# Patient Record
Sex: Female | Born: 1960
Health system: Southern US, Community
[De-identification: ages and names within clinical notes are randomized; demographics above are authoritative.]

## PROBLEM LIST (undated history)

## (undated) DIAGNOSIS — G43909 Migraine, unspecified, not intractable, without status migrainosus: Secondary | ICD-10-CM

## (undated) DIAGNOSIS — F319 Bipolar disorder, unspecified: Secondary | ICD-10-CM

---

## 2001-01-20 ENCOUNTER — Encounter: Admission: RE | Admit: 2001-01-20 | Discharge: 2001-01-20 | Payer: Self-pay | Admitting: Internal Medicine

## 2001-01-20 ENCOUNTER — Encounter: Payer: Self-pay | Admitting: Internal Medicine

## 2002-02-18 ENCOUNTER — Emergency Department (HOSPITAL_COMMUNITY): Admission: EM | Admit: 2002-02-18 | Discharge: 2002-02-18 | Payer: Self-pay

## 2002-07-17 ENCOUNTER — Other Ambulatory Visit: Admission: RE | Admit: 2002-07-17 | Discharge: 2002-07-17 | Payer: Self-pay | Admitting: Internal Medicine

## 2003-09-10 ENCOUNTER — Encounter (INDEPENDENT_AMBULATORY_CARE_PROVIDER_SITE_OTHER): Payer: Self-pay | Admitting: Specialist

## 2003-09-10 ENCOUNTER — Ambulatory Visit (HOSPITAL_COMMUNITY): Admission: RE | Admit: 2003-09-10 | Discharge: 2003-09-10 | Payer: Self-pay | Admitting: General Surgery

## 2003-11-09 ENCOUNTER — Other Ambulatory Visit: Admission: RE | Admit: 2003-11-09 | Discharge: 2003-11-09 | Payer: Self-pay | Admitting: Obstetrics & Gynecology

## 2004-04-18 ENCOUNTER — Emergency Department (HOSPITAL_COMMUNITY): Admission: EM | Admit: 2004-04-18 | Discharge: 2004-04-18 | Payer: Self-pay | Admitting: Emergency Medicine

## 2004-07-12 ENCOUNTER — Encounter: Admission: RE | Admit: 2004-07-12 | Discharge: 2004-07-12 | Payer: Self-pay | Admitting: Internal Medicine

## 2004-11-28 ENCOUNTER — Emergency Department (HOSPITAL_COMMUNITY): Admission: EM | Admit: 2004-11-28 | Discharge: 2004-11-28 | Payer: Self-pay | Admitting: Emergency Medicine

## 2005-01-02 ENCOUNTER — Other Ambulatory Visit: Admission: RE | Admit: 2005-01-02 | Discharge: 2005-01-02 | Payer: Self-pay | Admitting: Obstetrics & Gynecology

## 2005-01-21 ENCOUNTER — Encounter: Admission: RE | Admit: 2005-01-21 | Discharge: 2005-01-21 | Payer: Self-pay | Admitting: Family Medicine

## 2005-06-13 ENCOUNTER — Emergency Department (HOSPITAL_COMMUNITY): Admission: EM | Admit: 2005-06-13 | Discharge: 2005-06-14 | Payer: Self-pay | Admitting: Emergency Medicine

## 2005-10-30 ENCOUNTER — Ambulatory Visit: Payer: Self-pay | Admitting: Family Medicine

## 2005-11-22 ENCOUNTER — Emergency Department (HOSPITAL_COMMUNITY): Admission: EM | Admit: 2005-11-22 | Discharge: 2005-11-22 | Payer: Self-pay | Admitting: Family Medicine

## 2006-01-02 ENCOUNTER — Ambulatory Visit: Payer: Self-pay | Admitting: Family Medicine

## 2006-03-15 ENCOUNTER — Emergency Department (HOSPITAL_COMMUNITY): Admission: EM | Admit: 2006-03-15 | Discharge: 2006-03-15 | Payer: Self-pay | Admitting: Family Medicine

## 2006-05-15 ENCOUNTER — Ambulatory Visit: Payer: Self-pay | Admitting: Family Medicine

## 2006-05-17 ENCOUNTER — Ambulatory Visit: Payer: Self-pay | Admitting: Internal Medicine

## 2006-06-17 ENCOUNTER — Ambulatory Visit (HOSPITAL_COMMUNITY): Admission: RE | Admit: 2006-06-17 | Discharge: 2006-06-17 | Payer: Self-pay | Admitting: Family Medicine

## 2006-06-17 ENCOUNTER — Emergency Department (HOSPITAL_COMMUNITY): Admission: EM | Admit: 2006-06-17 | Discharge: 2006-06-17 | Payer: Self-pay | Admitting: Family Medicine

## 2008-10-06 ENCOUNTER — Emergency Department (HOSPITAL_COMMUNITY): Admission: EM | Admit: 2008-10-06 | Discharge: 2008-10-06 | Payer: Self-pay | Admitting: Family Medicine

## 2009-04-18 ENCOUNTER — Emergency Department (HOSPITAL_COMMUNITY): Admission: EM | Admit: 2009-04-18 | Discharge: 2009-04-18 | Payer: Self-pay | Admitting: Emergency Medicine

## 2010-09-24 ENCOUNTER — Encounter: Payer: Self-pay | Admitting: Family Medicine

## 2010-11-04 ENCOUNTER — Emergency Department (HOSPITAL_BASED_OUTPATIENT_CLINIC_OR_DEPARTMENT_OTHER)
Admission: EM | Admit: 2010-11-04 | Discharge: 2010-11-05 | Disposition: A | Discharge status: Home / Self Care | Payer: Self-pay | Attending: Emergency Medicine | Admitting: Emergency Medicine

## 2010-11-04 ENCOUNTER — Emergency Department (INDEPENDENT_AMBULATORY_CARE_PROVIDER_SITE_OTHER): Payer: Self-pay

## 2010-11-04 DIAGNOSIS — R05 Cough: Secondary | ICD-10-CM

## 2010-11-04 DIAGNOSIS — R0609 Other forms of dyspnea: Secondary | ICD-10-CM | POA: Insufficient documentation

## 2010-11-04 DIAGNOSIS — F17200 Nicotine dependence, unspecified, uncomplicated: Secondary | ICD-10-CM | POA: Insufficient documentation

## 2010-11-04 DIAGNOSIS — R059 Cough, unspecified: Secondary | ICD-10-CM | POA: Insufficient documentation

## 2010-11-04 DIAGNOSIS — J40 Bronchitis, not specified as acute or chronic: Secondary | ICD-10-CM | POA: Insufficient documentation

## 2010-11-04 DIAGNOSIS — R079 Chest pain, unspecified: Secondary | ICD-10-CM | POA: Insufficient documentation

## 2010-11-04 DIAGNOSIS — R0989 Other specified symptoms and signs involving the circulatory and respiratory systems: Secondary | ICD-10-CM | POA: Insufficient documentation

## 2010-12-19 LAB — POCT URINALYSIS DIP (DEVICE)
Nitrite: POSITIVE — AB
Protein, ur: 30 mg/dL — AB
Specific Gravity, Urine: 1.02 (ref 1.005–1.030)
Urobilinogen, UA: 0.2 mg/dL (ref 0.0–1.0)

## 2010-12-19 LAB — URINE CULTURE

## 2010-12-19 LAB — POCT I-STAT, CHEM 8
Creatinine, Ser: 0.7 mg/dL (ref 0.4–1.2)
HCT: 49 % — ABNORMAL HIGH (ref 36.0–46.0)
Hemoglobin: 16.7 g/dL — ABNORMAL HIGH (ref 12.0–15.0)
Sodium: 141 mEq/L (ref 135–145)
TCO2: 29 mmol/L (ref 0–100)

## 2011-01-19 NOTE — Op Note (Signed)
NAME:  Marilyn Ferguson, Marilyn Ferguson                           ACCOUNT NO.:  [id number]   MEDICAL RECORD NO.:  [id number]                   PATIENT TYPE:  AMB   LOCATION:  DAY                                  FACILITY:  [facility]   PHYSICIAN:  Timothy E. Earlene Plater, M.D.              DATE OF BIRTH:  23-Nov-1960   DATE OF PROCEDURE:  09/10/2003  DATE OF DISCHARGE:                                 OPERATIVE REPORT   PREOPERATIVE DIAGNOSES:  1. Mass, abdominal wall.  2. Mass of chin.   POSTOPERATIVE DIAGNOSES:  1. Sebaceous cyst of the chin.  2. Lipoma of abdominal wall.   OPERATIVE PROCEDURE:  Excision of lesions.   SURGEON:  Timothy E. Earlene Plater, M.D.   ANESTHESIA:  General.   [name] is 48, otherwise healthy.  Has these lesions as noted above and  wishes to have excisions and completely and thoroughly explained in the  office.  Because of her anxiety, general anesthesia is elected.  The patient  was seen and the lesions were identified and marked and a permit was signed.   She was taken to the operating room and placed supine and LMA anesthesia  provided.  The lesion of the left abdominal wall was noted, the ink mark  removed, and the skin prepped.  Marcaine 0.25% with epinephrine was used.  A  horizontal incision made of approximately 3 cm and a large, irregular,  multilobulated lipoma of greater than 6 cm removed.  I believe it was  completely removed.  There were no fragments, and the wound was empty.  Bleeding points were cauterized, the superficial subcu closed with Monocryl  and the skin closed with subcuticular Monocryl.  Steri-Strips applied, wound  intact, no evidence of complications.   The lesion of the chin was on the underside of the chin slightly to the left  of midline and was easily visible and palpable and had a pore.  This area  was prepped and draped with 0.25% Marcaine used, an elliptical incision made  to remove the pore and the underlying sebaceous cyst.  The sebaceous cyst  was  completely removed.  Bleeding was controlled, and the wound was closed  with inverted sutures of subcutaneous Monocryl.  No evidence of bleeding or  complication.  Steri-Strips applied.  She tolerated it well, counts correct.  She was removed to the recovery room.   Instructions and Percocet #24 given, and she will be seen and followed up as  an outpatient.                                               Timothy E. Earlene Plater, M.D.    TED/MEDQ  D:  09/10/2003  T:  09/10/2003  Job:  [id number]

## 2012-06-03 ENCOUNTER — Emergency Department (HOSPITAL_BASED_OUTPATIENT_CLINIC_OR_DEPARTMENT_OTHER): Payer: Medicare Other

## 2012-06-03 ENCOUNTER — Emergency Department (HOSPITAL_BASED_OUTPATIENT_CLINIC_OR_DEPARTMENT_OTHER)
Admission: EM | Admit: 2012-06-03 | Discharge: 2012-06-03 | Disposition: A | Discharge status: Home / Self Care | Payer: Medicare Other | Attending: Emergency Medicine | Admitting: Emergency Medicine

## 2012-06-03 ENCOUNTER — Encounter (HOSPITAL_BASED_OUTPATIENT_CLINIC_OR_DEPARTMENT_OTHER): Payer: Self-pay | Admitting: Family Medicine

## 2012-06-03 DIAGNOSIS — F17200 Nicotine dependence, unspecified, uncomplicated: Secondary | ICD-10-CM | POA: Insufficient documentation

## 2012-06-03 DIAGNOSIS — Z880 Allergy status to penicillin: Secondary | ICD-10-CM | POA: Insufficient documentation

## 2012-06-03 DIAGNOSIS — S43401A Unspecified sprain of right shoulder joint, initial encounter: Secondary | ICD-10-CM

## 2012-06-03 DIAGNOSIS — X500XXA Overexertion from strenuous movement or load, initial encounter: Secondary | ICD-10-CM | POA: Insufficient documentation

## 2012-06-03 DIAGNOSIS — IMO0002 Reserved for concepts with insufficient information to code with codable children: Secondary | ICD-10-CM | POA: Insufficient documentation

## 2012-06-03 MED ORDER — IBUPROFEN 800 MG PO TABS: 800.0000 mg | ORAL_TABLET | Freq: Three times a day (TID) | ORAL | Status: DC

## 2012-06-03 MED ORDER — HYDROCODONE-ACETAMINOPHEN 5-325 MG PO TABS: 2.0000 | ORAL_TABLET | ORAL | Status: DC | PRN

## 2012-06-03 NOTE — ED Notes (Signed)
Pt c/o of pain in right upper extremity(bicep area) that hurts with movement and making a fist. States it kind of radiate to neck but that more of a burning feeling. Took 5 Advil last night and 4 this morning.

## 2012-06-03 NOTE — ED Provider Notes (Signed)
History     CSN: [id number]  Arrival date & time 06/03/12  1231   First MD Initiated Contact with Patient 06/03/12 1309      Chief Complaint  Patient presents with  . Shoulder Pain    (Consider location/radiation/quality/duration/timing/severity/associated sxs/prior treatment) Patient is a 51 y.o. female presenting with shoulder pain. The history is provided by the patient. No language interpreter was used.  Shoulder Pain This is a new problem. Episode onset: 2 days ago. The problem occurs constantly. The problem has been unchanged. Associated symptoms include myalgias. Nothing aggravates the symptoms. She has tried nothing for the symptoms. The treatment provided moderate relief.  Pt reports she was doing squats and had pain in her right shoulder  History reviewed. No pertinent past medical history.  History reviewed. No pertinent past surgical history.  No family history on file.  History  Substance Use Topics  . Smoking status: Current Some Day Smoker  . Smokeless tobacco: Not on file  . Alcohol Use: No    OB History    Grav Para Term Preterm Abortions TAB SAB Ect Mult Living                  Review of Systems  Musculoskeletal: Positive for myalgias.  All other systems reviewed and are negative.    Allergies  Penicillins  Home Medications   Current Outpatient Rx  Name Route Sig Dispense Refill  . IBUPROFEN 200 MG PO TABS Oral Take 800 mg by mouth every 6 (six) hours as needed.      BP 137/84  Pulse 103  Temp 98.3 F (36.8 C) (Oral)  Resp 16  Ht 5' (1.524 m)  Wt 125 lb (56.7 kg)  BMI 24.41 kg/m2  SpO2 100%  Physical Exam  Nursing note and vitals reviewed. Constitutional: She appears well-developed and well-nourished.  HENT:  Head: Normocephalic.  Right Ear: External ear normal.  Musculoskeletal: Normal range of motion.       Tender right shoulder,  Pain with abduction and adduction  nv and ns intact  Neurological: She is alert.  Skin: Skin  is warm.  Psychiatric: She has a normal mood and affect.    ED Course  Procedures (including critical care time)  Labs Reviewed - No data to display No results found.   1. Sprain of shoulder, right       MDM  Pt placed in a sling.   I advised to have pt see Dr. Lajoyce Corners for evaluation.   Pt given rx for ibuprofen and hydrocodone        Lonia Skinner [place], [place] 06/03/12 1615

## 2012-06-03 NOTE — ED Notes (Signed)
Pt c/o right shoulder pain x 2 days while lifting weights. Pt sts she has h/o right shoulder injury. Pt has limited rom.

## 2012-06-04 NOTE — ED Provider Notes (Signed)
History/physical exam/procedure(s) were performed by non-physician practitioner and as supervising physician I was immediately available for consultation/collaboration. I have reviewed all notes and am in agreement with care and plan.   Hilario Quarry, MD 06/04/12 [phone]

## 2012-06-05 ENCOUNTER — Other Ambulatory Visit: Payer: Self-pay | Admitting: Orthopedic Surgery

## 2012-06-05 DIAGNOSIS — M25511 Pain in right shoulder: Secondary | ICD-10-CM

## 2012-06-09 ENCOUNTER — Other Ambulatory Visit: Payer: Medicare Other

## 2012-06-11 ENCOUNTER — Other Ambulatory Visit: Payer: Medicare Other

## 2012-10-02 ENCOUNTER — Emergency Department (HOSPITAL_COMMUNITY)
Admission: EM | Admit: 2012-10-02 | Discharge: 2012-10-02 | Disposition: A | Discharge status: Home / Self Care | Payer: Medicare Other | Attending: Emergency Medicine | Admitting: Emergency Medicine

## 2012-10-02 ENCOUNTER — Encounter (HOSPITAL_COMMUNITY): Payer: Self-pay

## 2012-10-02 DIAGNOSIS — F17200 Nicotine dependence, unspecified, uncomplicated: Secondary | ICD-10-CM | POA: Insufficient documentation

## 2012-10-02 DIAGNOSIS — R5381 Other malaise: Secondary | ICD-10-CM | POA: Insufficient documentation

## 2012-10-02 DIAGNOSIS — IMO0001 Reserved for inherently not codable concepts without codable children: Secondary | ICD-10-CM | POA: Insufficient documentation

## 2012-10-02 DIAGNOSIS — R1033 Periumbilical pain: Secondary | ICD-10-CM | POA: Insufficient documentation

## 2012-10-02 DIAGNOSIS — R630 Anorexia: Secondary | ICD-10-CM | POA: Insufficient documentation

## 2012-10-02 DIAGNOSIS — R509 Fever, unspecified: Secondary | ICD-10-CM | POA: Insufficient documentation

## 2012-10-02 DIAGNOSIS — H9209 Otalgia, unspecified ear: Secondary | ICD-10-CM | POA: Insufficient documentation

## 2012-10-02 DIAGNOSIS — R1110 Vomiting, unspecified: Secondary | ICD-10-CM

## 2012-10-02 DIAGNOSIS — R112 Nausea with vomiting, unspecified: Secondary | ICD-10-CM | POA: Insufficient documentation

## 2012-10-02 DIAGNOSIS — R197 Diarrhea, unspecified: Secondary | ICD-10-CM

## 2012-10-02 LAB — COMPREHENSIVE METABOLIC PANEL
AST: 14 U/L (ref 0–37)
Albumin: 3.7 g/dL (ref 3.5–5.2)
Alkaline Phosphatase: 52 U/L (ref 39–117)
BUN: 13 mg/dL (ref 6–23)
Chloride: 107 mEq/L (ref 96–112)
Potassium: 3.9 mEq/L (ref 3.5–5.1)
Total Bilirubin: 0.3 mg/dL (ref 0.3–1.2)

## 2012-10-02 LAB — URINALYSIS, ROUTINE W REFLEX MICROSCOPIC
Bilirubin Urine: NEGATIVE
Leukocytes, UA: NEGATIVE
Nitrite: NEGATIVE
Specific Gravity, Urine: 1.015 (ref 1.005–1.030)
pH: 7.5 (ref 5.0–8.0)

## 2012-10-02 LAB — CBC WITH DIFFERENTIAL/PLATELET
Basophils Absolute: 0 10*3/uL (ref 0.0–0.1)
Basophils Relative: 0 % (ref 0–1)
Eosinophils Relative: 1 % (ref 0–5)
HCT: 42.7 % (ref 36.0–46.0)
MCHC: 33 g/dL (ref 30.0–36.0)
MCV: 88 fL (ref 78.0–100.0)
Monocytes Absolute: 0.3 10*3/uL (ref 0.1–1.0)
RDW: 13.6 % (ref 11.5–15.5)

## 2012-10-02 LAB — URINE MICROSCOPIC-ADD ON

## 2012-10-02 MED ORDER — ONDANSETRON 4 MG PO TBDP
8.0000 mg | ORAL_TABLET | Freq: Once | ORAL | Status: AC
  Administered 2012-10-02: 8 mg via ORAL
  Filled 2012-10-02: qty 2

## 2012-10-02 MED ORDER — ONDANSETRON HCL 4 MG/2ML IJ SOLN
4.0000 mg | Freq: Once | INTRAMUSCULAR | Status: DC
  Filled 2012-10-02: qty 2

## 2012-10-02 MED ORDER — LOPERAMIDE HCL 2 MG PO CAPS: 2.0000 mg | ORAL_CAPSULE | Freq: Four times a day (QID) | ORAL | Status: DC | PRN

## 2012-10-02 MED ORDER — SODIUM CHLORIDE 0.9 % IV SOLN: 1000.0000 mL | Freq: Once | INTRAVENOUS | Status: DC

## 2012-10-02 MED ORDER — ONDANSETRON 8 MG PO TBDP: 8.0000 mg | ORAL_TABLET | Freq: Three times a day (TID) | ORAL | Status: DC | PRN

## 2012-10-02 MED ORDER — ACETAMINOPHEN 325 MG PO TABS
650.0000 mg | ORAL_TABLET | Freq: Once | ORAL | Status: AC
  Administered 2012-10-02: 650 mg via ORAL
  Filled 2012-10-02: qty 2

## 2012-10-02 MED ORDER — SODIUM CHLORIDE 0.9 % IV SOLN: 1000.0000 mL | INTRAVENOUS | Status: DC

## 2012-10-02 NOTE — ED Provider Notes (Signed)
History     CSN: [id number]  Arrival date & time 10/02/12  1154   First MD Initiated Contact with Patient 10/02/12 1224      No chief complaint on file.   Patient is a 52 y.o. female presenting with diarrhea and abdominal pain. The history is provided by the patient.  Diarrhea The primary symptoms include fever, abdominal pain, diarrhea and myalgias. Primary symptoms do not include nausea, vomiting or rash.  The diarrhea began 2 days ago. The diarrhea is watery, malodorous and mucous. The diarrhea occurs 2 to 4 times per day.  The illness is also significant for anorexia.  Abdominal Pain The primary symptoms of the illness include abdominal pain, fever and diarrhea. The primary symptoms of the illness do not include nausea or vomiting. The current episode started 2 days ago. The onset of the illness was gradual.  The pain came on gradually (cramping). The abdominal pain has been unchanged since its onset. The abdominal pain is located in the periumbilical region. The abdominal pain does not radiate (SHe tried imodium and her friend's vicodin with some relief.).  Additional symptoms associated with the illness include anorexia.  People at work have been sick.  So is a family member.  She was sent home from work and was told to get checked out.  History reviewed. No pertinent past medical history.  History reviewed. No pertinent past surgical history.  History reviewed. No pertinent family history.  History  Substance Use Topics  . Smoking status: Current Some Day Smoker  . Smokeless tobacco: Not on file  . Alcohol Use: No    OB History    Grav Para Term Preterm Abortions TAB SAB Ect Mult Living                  Review of Systems  Constitutional: Positive for fever.  Respiratory: Negative for cough.   Gastrointestinal: Positive for abdominal pain, diarrhea and anorexia. Negative for nausea and vomiting.  Musculoskeletal: Positive for myalgias.  Skin: Negative for rash.   All other systems reviewed and are negative.    Allergies  Penicillins  Home Medications   Current Outpatient Rx  Name  Route  Sig  Dispense  Refill  . HYDROCODONE-ACETAMINOPHEN 5-325 MG PO TABS   Oral   Take 2 tablets by mouth every 4 (four) hours as needed for pain.   10 tablet   0   . IBUPROFEN 200 MG PO TABS   Oral   Take 800 mg by mouth every 6 (six) hours as needed.         . IBUPROFEN 800 MG PO TABS   Oral   Take 1 tablet (800 mg total) by mouth 3 (three) times daily.   21 tablet   0     BP 137/71  Pulse 101  Temp 97.8 F (36.6 C) (Oral)  Resp 22  SpO2 100%  Physical Exam  Nursing note and vitals reviewed. Constitutional: She appears well-developed and well-nourished. No distress.  HENT:  Head: Normocephalic and atraumatic.  Right Ear: External ear normal.  Left Ear: External ear normal.  Mouth/Throat: No oropharyngeal exudate.  Eyes: Conjunctivae normal are normal. Right eye exhibits no discharge. Left eye exhibits no discharge. No scleral icterus.  Neck: Neck supple. No tracheal deviation present.  Cardiovascular: Normal rate, regular rhythm and intact distal pulses.   Pulmonary/Chest: Effort normal and breath sounds normal. No stridor. No respiratory distress. She has no wheezes. She has no rales.  Abdominal: Soft.  Bowel sounds are normal. She exhibits no distension. There is no tenderness. There is no rebound and no guarding.  Musculoskeletal: She exhibits no edema and no tenderness.  Neurological: She is alert. She has normal strength. No sensory deficit. Cranial nerve deficit:  no gross defecits noted. She exhibits normal muscle tone. She displays no seizure activity. Coordination normal.  Skin: Skin is warm and dry. No rash noted.  Psychiatric: She has a normal mood and affect.    ED Course  Procedures (including critical care time) Pt does not want an IV.  Would like to try oral fluids. Labs Reviewed  URINALYSIS, ROUTINE W REFLEX  MICROSCOPIC - Abnormal; Notable for the following:    APPearance CLOUDY (*)     Hgb urine dipstick SMALL (*)     All other components within normal limits  URINE MICROSCOPIC-ADD ON - Abnormal; Notable for the following:    Bacteria, UA MANY (*)     All other components within normal limits  COMPREHENSIVE METABOLIC PANEL  LIPASE, BLOOD  CBC WITH DIFFERENTIAL   No results found.   1. Vomiting and diarrhea       MDM  I suspect that the patient's symptoms are related to a viral illness. The patient be discharged home with medications for nausea and diarrhea. She is instructed to return to the emergency room for any worsening symptoms, fever or localized abdominal pain.       Celene Kras, MD 10/02/12 [phone]

## 2012-10-02 NOTE — ED Notes (Signed)
Pt c/o body ache, weakness, ear ache, nausea, vomiting, diarrhea, and abd pain x2 days. Pt states she has been unable to keep any food down. Pt states she has been taking imodium, tums, and half a vicodin, and fluids.

## 2012-10-02 NOTE — ED Notes (Signed)
Discharge instructions reviewed. Pt verbalized understanding.  

## 2012-10-02 NOTE — ED Notes (Signed)
Dr.Knapp at bedside  

## 2012-10-02 NOTE — ED Notes (Signed)
Pt presents with 2 day h/o suprapubic pain.  Pt reports pain is intermittent and worsens before having a bowel movement.  +nausea, vomiting and diarrhea.  Pt denies any dysuria but reports urine has foul odor, denies any vaginal discharge.

## 2013-03-24 ENCOUNTER — Other Ambulatory Visit: Payer: Self-pay

## 2013-10-13 ENCOUNTER — Other Ambulatory Visit: Payer: Self-pay | Admitting: Orthopaedic Surgery

## 2013-10-13 DIAGNOSIS — M79605 Pain in left leg: Secondary | ICD-10-CM

## 2013-10-13 DIAGNOSIS — M545 Low back pain, unspecified: Secondary | ICD-10-CM

## 2013-10-22 ENCOUNTER — Other Ambulatory Visit: Payer: Medicare Other

## 2013-10-29 ENCOUNTER — Other Ambulatory Visit: Payer: Medicare Other

## 2013-11-10 ENCOUNTER — Other Ambulatory Visit: Payer: Medicare Other

## 2013-11-29 ENCOUNTER — Other Ambulatory Visit: Payer: Medicare Other

## 2014-04-03 ENCOUNTER — Emergency Department (HOSPITAL_COMMUNITY): Payer: Medicare Other

## 2014-04-03 ENCOUNTER — Encounter (HOSPITAL_COMMUNITY): Payer: Self-pay | Admitting: Emergency Medicine

## 2014-04-03 ENCOUNTER — Emergency Department (HOSPITAL_COMMUNITY)
Admission: EM | Admit: 2014-04-03 | Discharge: 2014-04-03 | Disposition: A | Discharge status: Home / Self Care | Payer: Medicare Other | Attending: Emergency Medicine | Admitting: Emergency Medicine

## 2014-04-03 DIAGNOSIS — IMO0002 Reserved for concepts with insufficient information to code with codable children: Secondary | ICD-10-CM | POA: Diagnosis not present

## 2014-04-03 DIAGNOSIS — M549 Dorsalgia, unspecified: Secondary | ICD-10-CM | POA: Insufficient documentation

## 2014-04-03 DIAGNOSIS — Z79899 Other long term (current) drug therapy: Secondary | ICD-10-CM | POA: Diagnosis not present

## 2014-04-03 DIAGNOSIS — M4722 Other spondylosis with radiculopathy, cervical region: Secondary | ICD-10-CM

## 2014-04-03 DIAGNOSIS — M5380 Other specified dorsopathies, site unspecified: Secondary | ICD-10-CM | POA: Diagnosis not present

## 2014-04-03 DIAGNOSIS — M5412 Radiculopathy, cervical region: Secondary | ICD-10-CM | POA: Insufficient documentation

## 2014-04-03 DIAGNOSIS — Z880 Allergy status to penicillin: Secondary | ICD-10-CM | POA: Insufficient documentation

## 2014-04-03 DIAGNOSIS — Z87891 Personal history of nicotine dependence: Secondary | ICD-10-CM | POA: Diagnosis not present

## 2014-04-03 DIAGNOSIS — M62830 Muscle spasm of back: Secondary | ICD-10-CM

## 2014-04-03 MED ORDER — CYCLOBENZAPRINE HCL 5 MG PO TABS: 5.0000 mg | ORAL_TABLET | Freq: Three times a day (TID) | ORAL | Status: DC | PRN

## 2014-04-03 MED ORDER — PREDNISONE 20 MG PO TABS: 20.0000 mg | ORAL_TABLET | Freq: Two times a day (BID) | ORAL | Status: AC

## 2014-04-03 MED ORDER — PREDNISONE 20 MG PO TABS
60.0000 mg | ORAL_TABLET | Freq: Once | ORAL | Status: AC
  Administered 2014-04-03: 60 mg via ORAL
  Filled 2014-04-03: qty 3

## 2014-04-03 MED ORDER — TRAMADOL HCL 50 MG PO TABS: 50.0000 mg | ORAL_TABLET | Freq: Four times a day (QID) | ORAL | Status: DC | PRN

## 2014-04-03 NOTE — ED Provider Notes (Signed)
CSN: [id number]     Arrival date & time 04/03/14  1808 History   First MD Initiated Contact with Patient 04/03/14 1822   History provided by patient.   Chief Complaint  Patient presents with  . Back Pain   HPI  Patient reported acute onset Right upper back pain about 3 days ago, stated she woke up and after morning stretches got out of bed and "almost fell to her knees due to the severe pain" stated "the wind was knocked out of her", associated with sweating, felt nervous. Pain severe described as "aggravating aching" located Right mid back behind shoulder blade with radiation up to Right side of neck, no other radiation, no chest pain or low back pain. Denies paresthesias or radiating pain down arms/legs. Pain persisted throughout most of day and night, worse with activity, sitting up straight, turning head to Right, following 2 days admitted to intermittent improvement with pain, some relief with stretching and heating pad, no relief from vicodin and tramadol (stated she had left over from prior left wrist injury). Currently she woke up today with pain "3 times worse", pain worse with deep breaths and admits short of breath due to pain, however, if sitting leaning forward and not extending head, she is in 0/10 pain and "feels great". Admits to occasional tingling in Right hand. Denies other associated symptoms fevers/chills, HA, weakness, numbness, abdominal pain, n/v, urinary or stool incontinence.  Additional significant history, patient is active bodybuilder and weightlifter. Recently had not been to gym within 4 days prior to onset of symptoms. Denies recent injury, trauma, or accident.   History reviewed. No pertinent past medical history. Right shoulder rotator cuff tear  History reviewed. No pertinent past surgical history. No family history on file. History  Substance Use Topics  . Smoking status: Former Games developer  . Smokeless tobacco: Not on file  . Alcohol Use: No   OB History   Grav Para Term Preterm Abortions TAB SAB Ect Mult Living                 Review of Systems See above HPI   Allergies  Penicillins  Home Medications   Prior to Admission medications   Medication Sig Start Date End Date Taking? Authorizing Provider  HYDROcodone-acetaminophen (NORCO/VICODIN) 5-325 MG per tablet Take 2 tablets by mouth every 4 (four) hours as needed for pain. 06/03/12  Yes Lonia Skinner Sofia, PA-C  ibuprofen (ADVIL,MOTRIN) 200 MG tablet Take 200 mg by mouth every 6 (six) hours as needed for mild pain.   Yes Historical Provider, MD  traMADol (ULTRAM) 50 MG tablet Take 50 mg by mouth every 6 (six) hours as needed for moderate pain.   Yes Historical Provider, MD   BP 122/83  Pulse 104  Temp(Src) 98.2 F (36.8 C) (Oral)  Resp 18  SpO2 99% Physical Exam  Gen - well-appearing and healthy, cooperative / conversational, NAD HEENT - NCAT, PERRL, EOMI, oropharynx clear, MMM Neck - supple, non-tender to palpation. Limited R-rotation due to pain, FROM ext/flex and L-rotation Heart - Tachycardic, regular rhythm, no murmurs heard Lungs - CTAB, no wheezing, crackles, or rhonchi. Normal work of breathing. Abd - soft, NTND, no masses, +active BS Ext - non-tender, no edema, peripheral pulses intact +2 b/l MSK - Upper back/thoracic spine: no significant tenderness to palpation, mild tissue edema / muscle spasm with inc warmth and skin change vs left side, inc tenderness with C/T-spine extension, no tenderness over spinous processes down entire spine Skin - warm,  dry Neuro - awake, alert, oriented, grossly non-focal, intact muscle strength 5/5 b/l, intact distal sensation to light touch, gait normal   ED Course  Procedures (including critical care time) Labs Review Labs Reviewed - No data to display  Imaging Review No results found.   EKG Interpretation None      MDM   Final diagnoses:  None   52 yr F without significant PMH, previously healthy and active, presents with  acute episode of R-neck / Lower C / Upper T spine pain, x 3 days persistent with intermittent improvement, no acute inciting injury or trauma (regularly weightlifter, but no recent stress). Mild paresthesias down R-arm, symptoms improved with C/T-spine flexion and worsened with extension. Suspected radiculopathy with likely foraminal injury vs herniated disc (lower C-spine). Additional work-up no CP or SOB, EKG negative for acute changes. Vitals stable, pain controlled as long as not exacerbated by movement.  Proceed with CXR (rule out unlikely pneumothorax), C-spine x-ray series to eval for DJD. Start with Prednisone PO 60mg .  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 Discussed X-ray findings. Discharge with rx steroid burst with prednisone 20mg  BID x 5 days, Tramadol PRN pain, Flexeril PRN spasm, demonstrated manipulation scapular release technique, recommend cont heating pad, stretching and symptomatic control. Follow-up and return precautions.     Saralyn PilarAlexander Karamalegos, DO 04/03/14 2202

## 2014-04-03 NOTE — ED Notes (Signed)
Patient transported to X-ray 

## 2014-04-03 NOTE — ED Provider Notes (Signed)
Patient seen and evaluated with resident. Pertinent history and physical examination performed. Agree with initial assessment, evaluation and treatment initiated by resident. Face-to-face examination and review of evaluation findings were performed.  History includes- Right upper back pain with extension of neck and back. No trauma. No similar in past Physical Includes- Alert calm cooperative. TTP right thoracic, mild. Normal ROM arms and legs  EKG-   EKG Interpretation  Date/Time:  Saturday April 03 2014 18:14:37 EDT Ventricular Rate:  98 PR Interval:  156 QRS Duration: 70 QT Interval:  332 QTC Calculation: 423 R Axis:   93 Text Interpretation:  Sinus rhythm with marked sinus arrhythmia Rightward axis Septal infarct , age undetermined Abnormal ECG since last tracing no significant change Confirmed by Effie ShyWENTZ  MD, Kenny Rea [phone]([id number]) on 04/03/2014 9:47:36 PM       Disposition- home with symptomatic treatment.    Flint MelterElliott L Safa Derner, MD 04/04/14 [phone]

## 2014-04-03 NOTE — ED Notes (Signed)
Pt presents to department for evaluation of back pain. Ongoing x2 days. Denies recent injury. 8/10 pain upon arrival, states pain increases with deep breathing. 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 Pt is alert and oriented x4.

## 2014-04-03 NOTE — Discharge Instructions (Signed)
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 We performed an X-ray of your chest and your neck. The Chest X-ray was entirely normal. 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 This is most likely an acute flare of arthritis some possible nerve irritation and inflammation but no damage. Prescribed short steroid burst with Prednisone 20mg  tabs - take 1 tab twice daily (morning and evening) for total of 5 days. After complete course, you may try Ibuprofen as needed to help reduce inflammation. May use Tylenol For pain, prescribed Tramadol and Flexeril (can take 1-2 tabs up to 3 times daily for muscle spasm), be cautious when taking new medications as these may make you sleepy, common side effect is sedation, be careful with driving. Additionally, recommend to do conservative therapy, relative rest, avoid excessive activity or weightlifting during recovery, continue heating pad, muscle massage technique for shoulder blade. Recommend close follow-up within 1-2 weeks. Overall, this may take weeks to month to heal. Advise establishing with a primary doctor, call your insurance to locate one. If worsening symptoms acutely, or not improving in next few days, please return to the Emergency Department for further evaluation.  Osteoarthritis Osteoarthritis is a disease that causes soreness and inflammation of a joint. It occurs when the cartilage at the affected joint wears down. Cartilage acts as a cushion, covering the ends of bones where they meet to form a joint. Osteoarthritis is the most common form of arthritis. It often occurs in older people. The joints affected most often by this condition include those in the:  Ends of the fingers.  Thumbs.  Neck.  Lower back.  Knees.  Hips. CAUSES  Over time, the cartilage that covers the ends of  bones begins to wear away. This causes bone to rub on bone, producing pain and stiffness in the affected joints.  RISK FACTORS Certain factors can increase your chances of having osteoarthritis, including:  Older age.  Excessive body weight.  Overuse of joints.  Previous joint injury. SIGNS AND SYMPTOMS   Pain, swelling, and stiffness in the joint.  Over time, the joint may lose its normal shape.  Small deposits of bone (osteophytes) may grow on the edges of the joint.  Bits of bone or cartilage can break off and float inside the joint space. This may cause more pain and damage. DIAGNOSIS  Your health care provider will do a physical exam and ask about your symptoms. Various tests may be ordered, such as:  X-rays of the affected joint.  An MRI scan.  Blood tests to rule out other types of arthritis.  Joint fluid tests. This involves using a needle to draw fluid from the joint and examining the fluid under a microscope. TREATMENT  Goals of treatment are to control pain and improve joint function. Treatment plans may include:  A prescribed exercise program that allows for rest and joint relief.  A weight control plan.  Pain relief techniques, such as:  Properly applied heat and cold.  Electric pulses delivered to nerve endings under the skin (transcutaneous electrical nerve stimulation [TENS]).  Massage.  Certain nutritional supplements.  Medicines to control pain, such as:  Acetaminophen.  Nonsteroidal anti-inflammatory drugs (NSAIDs), such as naproxen.  Narcotic or central-acting agents, such as tramadol.  Corticosteroids. These can be given orally or as an injection.  Surgery to reposition the bones and relieve  pain (osteotomy) or to remove loose pieces of bone and cartilage. Joint replacement may be needed in advanced states of osteoarthritis. HOME CARE INSTRUCTIONS   Take medicines only as directed by your health care provider.  Maintain a healthy  weight. Follow your health care provider's instructions for weight control. This may include dietary instructions.  Exercise as directed. Your health care provider can recommend specific types of exercise. These may include:  Strengthening exercises. These are done to strengthen the muscles that support joints affected by arthritis. They can be performed with weights or with exercise bands to add resistance.  Aerobic activities. These are exercises, such as brisk walking or low-impact aerobics, that get your heart pumping.  Range-of-motion activities. These keep your joints limber.  Balance and agility exercises. These help you maintain daily living skills.  Rest your affected joints as directed by your health care provider.  Keep all follow-up visits as directed by your health care provider. SEEK MEDICAL CARE IF:   Your skin turns red.  You develop a rash in addition to your joint pain.  You have worsening joint pain.  You have a fever along with joint or muscle aches. SEEK IMMEDIATE MEDICAL CARE IF:  You have a significant loss of weight or appetite.  You have night sweats. FOR MORE INFORMATION   National Institute of Arthritis and Musculoskeletal and Skin Diseases: www.niams.http://www.myers.net/  [employer] on Aging: https://walker.com/  American College of Rheumatology: www.rheumatology.org Document Released: 08/20/2005 Document Revised: 01/04/2014 Document Reviewed: 04/27/2013 [facility] Patient Information 2015 [place], [place]. This information is not intended to replace advice given to you by your health care provider. Make sure you discuss any questions you have with your health care provider.   Degenerative Disk Disease Degenerative disk disease is a condition caused by the changes that occur in the cushions of the backbone (spinal disks) as you grow older. Spinal disks are soft and compressible disks located between the bones of the spine (vertebrae). They act like shock  absorbers. Degenerative disk disease can affect the whole spine. However, the neck and lower back are most commonly affected. Many changes can occur in the spinal disks with aging, such as:  The spinal disks may dry and shrink.  Small tears may occur in the tough, outer covering of the disk (annulus).  The disk space may become smaller due to loss of water.  Abnormal growths in the bone (spurs) may occur. This can put pressure on the nerve roots exiting the spinal canal, causing pain.  The spinal canal may become narrowed. CAUSES  Degenerative disk disease is a condition caused by the changes that occur in the spinal disks with aging. The exact cause is not known, but there is a genetic basis for many patients. Degenerative changes can occur due to loss of fluid in the disk. This makes the disk thinner and reduces the space between the backbones. Small cracks can develop in the outer layer of the disk. This can lead to the breakdown of the disk. You are more likely to get degenerative disk disease if you are overweight. Smoking cigarettes and doing heavy work such as weightlifting can also increase your risk of this condition. Degenerative changes can start after a sudden injury. Growth of bone spurs can compress the nerve roots and cause pain.  SYMPTOMS  The symptoms vary from person to person. Some people may have no pain, while others have severe pain. The pain may be so severe that it can limit your activities. The  location of the pain depends on the part of your backbone that is affected. You will have neck or arm pain if a disk in the neck area is affected. You will have pain in your back, buttocks, or legs if a disk in the lower back is affected. The pain becomes worse while bending, reaching up, or with twisting movements. The pain may start gradually and then get worse as time passes. It may also start after a major or minor injury. You may feel numbness or tingling in the arms or legs.    DIAGNOSIS  Your caregiver will ask you about your symptoms and about activities or habits that may cause the pain. He or she may also ask about any injuries, diseases, or treatments you have had earlier. Your caregiver will examine you to check for the range of movement that is possible in the affected area, to check for strength in your extremities, and to check for sensation in the areas of the arms and legs supplied by different nerve roots. An X-ray of the spine may be taken. Your caregiver may suggest other imaging tests, such as magnetic resonance imaging (MRI), if needed.  TREATMENT  Treatment includes rest, modifying your activities, and applying ice and heat. Your caregiver may prescribe medicines to reduce your pain and may ask you to do some exercises to strengthen your back. In some cases, you may need surgery. You and your caregiver will decide on the treatment that is best for you. HOME CARE INSTRUCTIONS   Follow proper lifting and walking techniques as advised by your caregiver.  Maintain good posture.  Exercise regularly as advised.  Perform relaxation exercises.  Change your sitting, standing, and sleeping habits as advised. Change positions frequently.  Lose weight as advised.  Stop smoking if you smoke.  Wear supportive footwear. SEEK MEDICAL CARE IF:  Your pain does not go away within 1 to 4 weeks. SEEK IMMEDIATE MEDICAL CARE IF:   Your pain is severe.  You notice weakness in your arms, hands, or legs.  You begin to lose control of your bladder or bowel movements. MAKE SURE YOU:   Understand these instructions.  Will watch your condition.  Will get help right away if you are not doing well or get worse. Document Released: 06/17/2007 Document Revised: 11/12/2011 Document Reviewed: 12/22/2013 [facility] Patient Information 2015 [place], [place]. This information is not intended to replace advice given to you by your health care provider. Make sure you discuss  any questions you have with your health care provider.

## 2014-04-03 NOTE — ED Notes (Signed)
When patient moves pain is a 10 but, when patient stays still pain is zero.

## 2014-04-03 NOTE — ED Notes (Signed)
Pt states back pain started day x2. She said pain increased today, the pain is what woke her up this morning. She stated she is unable to turn her head to the right. She rates pain 0/0 when she bends over . However when she sits up she is in a 9/10. The pain is down the the middle of the back. She states she has tried stretching, and a heating pad to relieve pain and nothing is working. Pt states she took  tramadol 1100.

## 2014-04-26 ENCOUNTER — Ambulatory Visit: Payer: Medicare Other | Admitting: Internal Medicine

## 2016-09-13 DIAGNOSIS — R413 Other amnesia: Secondary | ICD-10-CM | POA: Diagnosis not present

## 2016-09-13 DIAGNOSIS — F3130 Bipolar disorder, current episode depressed, mild or moderate severity, unspecified: Secondary | ICD-10-CM | POA: Diagnosis not present

## 2016-09-13 DIAGNOSIS — Z1211 Encounter for screening for malignant neoplasm of colon: Secondary | ICD-10-CM | POA: Diagnosis not present

## 2016-09-14 DIAGNOSIS — Z1211 Encounter for screening for malignant neoplasm of colon: Secondary | ICD-10-CM | POA: Diagnosis not present

## 2016-09-24 DIAGNOSIS — F3132 Bipolar disorder, current episode depressed, moderate: Secondary | ICD-10-CM | POA: Diagnosis not present

## 2016-11-15 DIAGNOSIS — F3132 Bipolar disorder, current episode depressed, moderate: Secondary | ICD-10-CM | POA: Diagnosis not present

## 2016-12-06 DIAGNOSIS — F3132 Bipolar disorder, current episode depressed, moderate: Secondary | ICD-10-CM | POA: Diagnosis not present

## 2016-12-06 DIAGNOSIS — F3112 Bipolar disorder, current episode manic without psychotic features, moderate: Secondary | ICD-10-CM | POA: Diagnosis not present

## 2017-01-22 DIAGNOSIS — F411 Generalized anxiety disorder: Secondary | ICD-10-CM | POA: Diagnosis not present

## 2017-01-22 DIAGNOSIS — F3112 Bipolar disorder, current episode manic without psychotic features, moderate: Secondary | ICD-10-CM | POA: Diagnosis not present

## 2017-02-12 DIAGNOSIS — F3112 Bipolar disorder, current episode manic without psychotic features, moderate: Secondary | ICD-10-CM | POA: Diagnosis not present

## 2017-02-12 DIAGNOSIS — F411 Generalized anxiety disorder: Secondary | ICD-10-CM | POA: Diagnosis not present

## 2017-02-24 ENCOUNTER — Encounter (HOSPITAL_COMMUNITY): Payer: Self-pay | Admitting: Emergency Medicine

## 2017-02-24 ENCOUNTER — Emergency Department (HOSPITAL_COMMUNITY)
Admission: EM | Admit: 2017-02-24 | Discharge: 2017-02-24 | Disposition: A | Discharge status: Home / Self Care | Payer: Medicare Other | Attending: Emergency Medicine | Admitting: Emergency Medicine

## 2017-02-24 DIAGNOSIS — S0990XA Unspecified injury of head, initial encounter: Secondary | ICD-10-CM | POA: Diagnosis present

## 2017-02-24 DIAGNOSIS — S0003XA Contusion of scalp, initial encounter: Secondary | ICD-10-CM | POA: Diagnosis not present

## 2017-02-24 DIAGNOSIS — Z87891 Personal history of nicotine dependence: Secondary | ICD-10-CM | POA: Insufficient documentation

## 2017-02-24 DIAGNOSIS — Y92410 Unspecified street and highway as the place of occurrence of the external cause: Secondary | ICD-10-CM | POA: Insufficient documentation

## 2017-02-24 DIAGNOSIS — Y999 Unspecified external cause status: Secondary | ICD-10-CM | POA: Insufficient documentation

## 2017-02-24 DIAGNOSIS — Y939 Activity, unspecified: Secondary | ICD-10-CM | POA: Diagnosis not present

## 2017-02-24 DIAGNOSIS — M542 Cervicalgia: Secondary | ICD-10-CM

## 2017-02-24 MED ORDER — CYCLOBENZAPRINE HCL 10 MG PO TABS: 10.0000 mg | ORAL_TABLET | Freq: Three times a day (TID) | ORAL | 0 refills | Status: DC | PRN

## 2017-02-24 MED ORDER — IBUPROFEN 600 MG PO TABS: 600.0000 mg | ORAL_TABLET | Freq: Three times a day (TID) | ORAL | 0 refills | Status: DC | PRN
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
## 2017-02-24 NOTE — Discharge Instructions (Signed)
Read the information below.  Use the prescribed medication as directed.  Please discuss all new medications with your pharmacist.  You may return to the Emergency Department at any time for worsening condition or any new symptoms that concern you.    ° °You have had a head injury which does not appear to require admission at this time. A concussion is a state of changed mental ability from trauma. °SEEK IMMEDIATE MEDICAL ATTENTION IF: °There is confusion or drowsiness (although children frequently become drowsy after injury).  °You cannot awaken the injured person.  °There is nausea (feeling sick to your stomach) or continued, forceful vomiting.  °You notice dizziness or unsteadiness which is getting worse, or inability to walk.  °You have convulsions or unconsciousness.  °You experience severe, persistent headaches not relieved by Tylenol?. (Do not take aspirin as this impairs clotting abilities). Take other pain medications only as directed.  °You cannot use arms or legs normally.  °There are changes in pupil sizes. (This is the black center in the colored part of the eye)  °There is clear or bloody discharge from the nose or ears.  °Change in speech, vision, swallowing, or understanding.  °Localized weakness, numbness, tingling, or change in bowel or bladder control.  °

## 2017-02-24 NOTE — ED Notes (Signed)
Called for triage with no response  

## 2017-02-26 DIAGNOSIS — S161XXA Strain of muscle, fascia and tendon at neck level, initial encounter: Secondary | ICD-10-CM | POA: Diagnosis not present

## 2017-02-26 DIAGNOSIS — R51 Headache: Secondary | ICD-10-CM | POA: Diagnosis not present

## 2017-02-26 DIAGNOSIS — S0003XA Contusion of scalp, initial encounter: Secondary | ICD-10-CM | POA: Diagnosis not present

## 2017-11-19 ENCOUNTER — Encounter (HOSPITAL_COMMUNITY): Payer: Self-pay | Admitting: Emergency Medicine

## 2017-11-19 ENCOUNTER — Ambulatory Visit (HOSPITAL_COMMUNITY)
Admission: EM | Admit: 2017-11-19 | Discharge: 2017-11-19 | Disposition: A | Discharge status: Home / Self Care | Payer: Medicare Other | Attending: Family Medicine | Admitting: Family Medicine

## 2017-11-19 DIAGNOSIS — K047 Periapical abscess without sinus: Secondary | ICD-10-CM

## 2017-11-19 DIAGNOSIS — R197 Diarrhea, unspecified: Secondary | ICD-10-CM

## 2017-11-19 HISTORY — DX: Bipolar disorder, unspecified: F31.9

## 2017-11-19 HISTORY — DX: Migraine, unspecified, not intractable, without status migrainosus: G43.909

## 2017-11-19 LAB — POCT URINALYSIS DIP (DEVICE)
Glucose, UA: NEGATIVE mg/dL
Ketones, ur: >=160 mg/dL — AB
Leukocytes, UA: NEGATIVE
Nitrite: NEGATIVE
PH: 5.5 (ref 5.0–8.0)
Protein, ur: 30 mg/dL — AB
Urobilinogen, UA: 0.2 mg/dL (ref 0.0–1.0)

## 2017-11-19 MED ORDER — SULFAMETHOXAZOLE-TRIMETHOPRIM 800-160 MG PO TABS: 1.0000 | ORAL_TABLET | Freq: Two times a day (BID) | ORAL | 0 refills | Status: AC

## 2017-11-19 NOTE — ED Triage Notes (Signed)
Pt states three days ago, "I was at the park and I got a really bad migraine, I went home and laid down, woke up took a BC powder and it went away. Then my side started hurting on my left kidney, I had some diarrhea for three days."

## 2017-11-19 NOTE — ED Provider Notes (Signed)
[facility]   [id number] 11/19/17 Arrival Time: 1200   SUBJECTIVE:  Marilyn Ferguson is a 57 y.o. female who presents to the urgent care with complaint of "I was at the park and I got a really bad migraine, I went home and laid down, woke up took a BC powder and it went away. Then my side started hurting on my left kidney, I had some diarrhea for three days."  Also c/o dental pain.  Past Medical History:  Diagnosis Date  . Bipolar 1 disorder (HCC)   . Migraine    No family history on file. Social History   Socioeconomic History  . Marital status: Single    Spouse name: Not on file  . Number of children: Not on file  . Years of education: Not on file  . Highest education level: Not on file  Social Needs  . Financial resource strain: Not on file  . Food insecurity - worry: Not on file  . Food insecurity - inability: Not on file  . Transportation needs - medical: Not on file  . Transportation needs - non-medical: Not on file  Occupational History  . Not on file  Tobacco Use  . Smoking status: Former Smoker  Substance and Sexual Activity  . Alcohol use: No  . Drug use: No  . Sexual activity: Not on file  Other Topics Concern  . Not on file  Social History Narrative  . Not on file   Current Meds  Medication Sig  . AMITRIPTYLINE HCL PO Take by mouth.  Marland Kitchen LITHIUM PO Take by mouth.   Allergies  Allergen Reactions  . Penicillins Other (See Comments)    Unknown "stop breathing"      ROS: As per HPI, remainder of ROS negative.   OBJECTIVE:   Vitals:   11/19/17 1245  BP: (!) 149/99  Pulse: (!) 113  Resp: (!) 24  Temp: 98.6 F (37 C)  SpO2: 100%     General appearance: alert; no distress Eyes: PERRL; EOMI; conjunctiva normal HENT: normocephalic; atraumatic; TMs normal, canal normal, external ears normal without trauma; nasal mucosa normal; oral mucosa swollen and red gum of tooth #10 Neck: supple Lungs: clear to auscultation bilaterally Heart:  regular rate and rhythm Abdomen: soft, mild diffuse tenderness; hyperactive bowel sounds normal; no masses or organomegaly; no guarding or rebound tenderness Back: no CVA tenderness Extremities: no cyanosis or edema; symmetrical with no gross deformities Skin: warm and dry Neurologic: normal gait; grossly normal Psychological: alert and cooperative; normal mood and affect      Labs:  Results for orders placed or performed during the hospital encounter of 11/19/17  POCT urinalysis dip (device)  Result Value Ref Range   Glucose, UA NEGATIVE NEGATIVE mg/dL   Bilirubin Urine SMALL (A) NEGATIVE   Ketones, ur >=160 (A) NEGATIVE mg/dL   Specific Gravity, Urine >=1.030 1.005 - 1.030   Hgb urine dipstick LARGE (A) NEGATIVE   pH 5.5 5.0 - 8.0   Protein, ur 30 (A) NEGATIVE mg/dL   Urobilinogen, UA 0.2 0.0 - 1.0 mg/dL   Nitrite NEGATIVE NEGATIVE   Leukocytes, UA NEGATIVE NEGATIVE    Labs Reviewed  POCT URINALYSIS DIP (DEVICE) - Abnormal; Notable for the following components:      Result Value   Bilirubin Urine SMALL (*)    Ketones, ur >=160 (*)    Hgb urine dipstick LARGE (*)    Protein, ur 30 (*)    All other components within normal limits  No results found.     ASSESSMENT & PLAN:  1. Diarrhea of presumed infectious origin   2. Dental abscess     Meds ordered this encounter  Medications  . sulfamethoxazole-trimethoprim (BACTRIM DS,SEPTRA DS) 800-160 MG tablet    Sig: Take 1 tablet by mouth 2 (two) times daily for 7 days.    Dispense:  14 tablet    Refill:  0    Reviewed expectations re: course of current medical issues. Questions answered. Outlined signs and symptoms indicating need for more acute intervention. Patient verbalized understanding. After Visit Summary given.    Procedures:      Elvina SidleLauenstein, Caine Barfield, MD 11/19/17 1430

## 2017-11-19 NOTE — Discharge Instructions (Signed)
[id number]  phone number is [phone];  or call [facility]  Danise Edgerevor Noah, "Born a Crime"

## 2017-11-19 NOTE — ED Triage Notes (Signed)
Pt also states she has a bad infected tooth.

## 2018-01-09 ENCOUNTER — Ambulatory Visit (HOSPITAL_COMMUNITY)
Admission: EM | Admit: 2018-01-09 | Discharge: 2018-01-09 | Disposition: A | Discharge status: Home / Self Care | Payer: Medicare Other

## 2018-01-09 ENCOUNTER — Encounter (HOSPITAL_COMMUNITY): Payer: Self-pay | Admitting: Emergency Medicine

## 2018-01-09 DIAGNOSIS — K047 Periapical abscess without sinus: Secondary | ICD-10-CM

## 2018-01-09 MED ORDER — CLINDAMYCIN HCL 300 MG PO CAPS: 300.0000 mg | ORAL_CAPSULE | Freq: Four times a day (QID) | ORAL | 0 refills | Status: AC

## 2018-01-09 MED ORDER — HYDROCODONE-ACETAMINOPHEN 5-325 MG PO TABS: 1.0000 | ORAL_TABLET | Freq: Four times a day (QID) | ORAL | 0 refills | Status: DC | PRN

## 2018-01-09 NOTE — ED Triage Notes (Signed)
PT reports dental abscess for 2 days.

## 2018-01-09 NOTE — Discharge Instructions (Signed)

## 2018-01-10 NOTE — ED Provider Notes (Signed)
[facility]    CSN: [id number] Arrival date & time: 01/09/18  1318     History   Chief Complaint Chief Complaint  Patient presents with  . Dental Pain    HPI Marilyn Ferguson is a 57 y.o. female presenting today for evaluation of dental abscess.  Patient has had facial swelling and dental pain for the past 2 days.  Pain flares up and down, worse at night.  Has been taking ibuprofen with minimal relief.  Denies any drainage.  Patient working on getting set up with dentistry.  Has had dental issues since having oral surgery many years ago.  Has a partial in place on the left side of her upper jaw.  HPI  Past Medical History:  Diagnosis Date  . Bipolar 1 disorder (HCC)   . Migraine     There are no active problems to display for this patient.   History reviewed. No pertinent surgical history.  OB History   None      Home Medications    Prior to Admission medications   Medication Sig Start Date End Date Taking? Authorizing Provider  traZODone (DESYREL) 50 MG tablet Take 50 mg by mouth at bedtime.   Yes [provider]  AMITRIPTYLINE HCL PO Take by mouth.    [provider]  clindamycin (CLEOCIN) 300 MG capsule Take 1 capsule (300 mg total) by mouth 4 (four) times daily for 7 days. 01/09/18 01/16/18  Malikai Gut C, PA-C  HYDROcodone-acetaminophen (NORCO/VICODIN) 5-325 MG tablet Take 1 tablet by mouth every 6 (six) hours as needed. 01/09/18   Delman Goshorn C, PA-C  LITHIUM PO Take by mouth.    [provider]    Family History No family history on file.  Social History Social History   Tobacco Use  . Smoking status: Former Smoker  Substance Use Topics  . Alcohol use: No  . Drug use: No     Allergies   Penicillins   Review of Systems Review of Systems  Constitutional: Negative for activity change, appetite change, fatigue and fever.  HENT: Positive for dental problem, ear pain and facial swelling. Negative for sore  throat and trouble swallowing.   Eyes: Negative for pain and visual disturbance.  Respiratory: Negative for shortness of breath.   Cardiovascular: Negative for chest pain.  Gastrointestinal: Negative for abdominal pain, nausea and vomiting.  Neurological: Positive for headaches. Negative for dizziness, speech difficulty, weakness, light-headedness and numbness.     Physical Exam Triage Vital Signs ED Triage Vitals  Enc Vitals Group     BP 01/09/18 1405 (!) 146/78     Pulse Rate 01/09/18 1405 85     Resp 01/09/18 1405 16     Temp 01/09/18 1405 98.2 F (36.8 C)     Temp Source 01/09/18 1405 Oral     SpO2 01/09/18 1405 100 %     Weight 01/09/18 1406 123 lb (55.8 kg)     Height --      Head Circumference --      Peak Flow --      Pain Score 01/09/18 1404 6     Pain Loc --      Pain Edu? --      Excl. in [place]? --    No data found.  Updated Vital Signs BP (!) 146/78   Pulse 85   Temp 98.2 F (36.8 C) (Oral)   Resp 16   Wt 123 lb (55.8 kg)   SpO2 100%  BMI 24.02 kg/m   Visual Acuity Right Eye Distance:   Left Eye Distance:   Bilateral Distance:    Right Eye Near:   Left Eye Near:    Bilateral Near:     Physical Exam  Constitutional: She appears well-developed and well-nourished. No distress.  HENT:  Head: Normocephalic and atraumatic.  Patient with mild swelling to left maxillary area, partial plate in place on left upper jaw near posterior molars, significant swelling and tenderness surrounding posterior molars  Left TM without erythema, EAC without abnormality  Eyes: Conjunctivae are normal.  Neck: Neck supple.  Cardiovascular: Normal rate and regular rhythm.  No murmur heard. Pulmonary/Chest: Effort normal and breath sounds normal. No respiratory distress.  Abdominal: Soft. There is no tenderness.  Musculoskeletal: She exhibits no edema.  Neurological: She is alert.  Skin: Skin is warm and dry.  Psychiatric: She has a normal mood and affect.  Nursing  note and vitals reviewed.    UC Treatments / Results  Labs (all labs ordered are listed, but only abnormal results are displayed) Labs Reviewed - No data to display  EKG None  Radiology No results found.  Procedures Procedures (including critical care time)  Medications Ordered in UC Medications - No data to display  Initial Impression / Assessment and Plan / UC Course  I have reviewed the triage vital signs and the nursing notes.  Pertinent labs & imaging results that were available during my care of the patient were reviewed by me and considered in my medical decision making (see chart for details).     Patient with dental abscess, will be initiated on clindamycin and she is allergic to penicillins.  We will also provide a few hydrocodone to use for severe pain or nighttime pain.  Discussed sedation regarding this.  Tylenol and ibuprofen during the day.  Dental resources provided.Discussed strict return precautions. Patient verbalized understanding and is agreeable with plan.  Final Clinical Impressions(s) / UC Diagnoses   Final diagnoses:  Dental abscess     Discharge Instructions     Please use dental resource to contact offices to seek permenant treatment/relief.   Today we have given you an antibiotic. This should help with pain as any infection is cleared.   For pain please take -  of Ibuprofen every 8 hours, take with 1000 mg of Tylenol Extra strength every 8 hours. These are safe to take together. Please take with food.   I have also provided 2 days worth of stronger pain medication. This should only be used for severe pain. Do not drive or operate machinery while taking this medication.   Please return if you start to experience significant swelling of your face, experiencing fever.   ED Prescriptions    Medication Sig Dispense Auth. Provider   clindamycin (CLEOCIN) 300 MG capsule Take 1 capsule (300 mg total) by mouth 4 (four) times daily for 7  days. 28 capsule Oumar Marcott C, PA-C   HYDROcodone-acetaminophen (NORCO/VICODIN) 5-325 MG tablet Take 1 tablet by mouth every 6 (six) hours as needed. 8 tablet Rhanda Lemire, [place] C, PA-C     Controlled Substance Prescriptions [place] Controlled Substance Registry consulted? Yes, I have consulted the [place] Controlled Substances Registry for this patient, and feel the risk/benefit ratio today is favorable for proceeding with this prescription for a controlled substance.   Sharyon Cable [place] C, [place] 01/10/18 [phone]

## 2018-12-26 DIAGNOSIS — M25512 Pain in left shoulder: Secondary | ICD-10-CM | POA: Diagnosis not present

## 2018-12-26 DIAGNOSIS — R5383 Other fatigue: Secondary | ICD-10-CM | POA: Diagnosis not present

## 2018-12-26 DIAGNOSIS — Z1231 Encounter for screening mammogram for malignant neoplasm of breast: Secondary | ICD-10-CM | POA: Diagnosis not present

## 2018-12-26 DIAGNOSIS — M129 Arthropathy, unspecified: Secondary | ICD-10-CM | POA: Diagnosis not present

## 2018-12-26 DIAGNOSIS — E7800 Pure hypercholesterolemia, unspecified: Secondary | ICD-10-CM | POA: Diagnosis not present

## 2018-12-26 DIAGNOSIS — Z79899 Other long term (current) drug therapy: Secondary | ICD-10-CM | POA: Diagnosis not present

## 2018-12-26 DIAGNOSIS — Z0000 Encounter for general adult medical examination without abnormal findings: Secondary | ICD-10-CM | POA: Diagnosis not present

## 2018-12-26 DIAGNOSIS — Z114 Encounter for screening for human immunodeficiency virus [HIV]: Secondary | ICD-10-CM | POA: Diagnosis not present

## 2018-12-29 ENCOUNTER — Other Ambulatory Visit: Payer: Self-pay | Admitting: Internal Medicine

## 2018-12-29 DIAGNOSIS — Z1231 Encounter for screening mammogram for malignant neoplasm of breast: Secondary | ICD-10-CM

## 2019-01-09 DIAGNOSIS — R946 Abnormal results of thyroid function studies: Secondary | ICD-10-CM | POA: Diagnosis not present

## 2019-01-09 DIAGNOSIS — E7800 Pure hypercholesterolemia, unspecified: Secondary | ICD-10-CM | POA: Diagnosis not present

## 2019-01-09 DIAGNOSIS — R945 Abnormal results of liver function studies: Secondary | ICD-10-CM | POA: Diagnosis not present

## 2019-01-09 DIAGNOSIS — Z1159 Encounter for screening for other viral diseases: Secondary | ICD-10-CM | POA: Diagnosis not present

## 2019-01-09 DIAGNOSIS — M25512 Pain in left shoulder: Secondary | ICD-10-CM | POA: Diagnosis not present

## 2019-01-09 DIAGNOSIS — Z79899 Other long term (current) drug therapy: Secondary | ICD-10-CM | POA: Diagnosis not present

## 2019-01-09 DIAGNOSIS — R5383 Other fatigue: Secondary | ICD-10-CM | POA: Diagnosis not present

## 2019-02-09 DIAGNOSIS — G43909 Migraine, unspecified, not intractable, without status migrainosus: Secondary | ICD-10-CM | POA: Diagnosis not present

## 2019-02-09 DIAGNOSIS — E7800 Pure hypercholesterolemia, unspecified: Secondary | ICD-10-CM | POA: Diagnosis not present

## 2019-02-09 DIAGNOSIS — M25512 Pain in left shoulder: Secondary | ICD-10-CM | POA: Diagnosis not present

## 2019-02-09 DIAGNOSIS — Z79899 Other long term (current) drug therapy: Secondary | ICD-10-CM | POA: Diagnosis not present

## 2019-03-04 ENCOUNTER — Ambulatory Visit: Payer: Medicare Other

## 2019-06-20 ENCOUNTER — Other Ambulatory Visit: Payer: Self-pay

## 2019-06-20 ENCOUNTER — Ambulatory Visit (HOSPITAL_COMMUNITY)
Admission: EM | Admit: 2019-06-20 | Discharge: 2019-06-20 | Disposition: A | Discharge status: Home / Self Care | Payer: Medicare HMO | Attending: Family Medicine | Admitting: Family Medicine

## 2019-06-20 ENCOUNTER — Encounter (HOSPITAL_COMMUNITY): Payer: Self-pay | Admitting: Emergency Medicine

## 2019-06-20 DIAGNOSIS — M659 Synovitis and tenosynovitis, unspecified: Secondary | ICD-10-CM

## 2019-06-20 MED ORDER — PREDNISONE 50 MG PO TABS: ORAL_TABLET | ORAL | 0 refills | Status: DC

## 2019-06-20 MED ORDER — HYDROCODONE-ACETAMINOPHEN 5-325 MG PO TABS: 1.0000 | ORAL_TABLET | Freq: Four times a day (QID) | ORAL | 0 refills | Status: AC | PRN

## 2019-06-20 NOTE — ED Provider Notes (Signed)
[place]    CSN: [id number] Arrival date & time: 06/20/19  1030      History   Chief Complaint Chief Complaint  Patient presents with  . Arm Pain    HPI Marilyn Ferguson is a 58 y.o. female.   [facility] urgent care patient  Reports right arm pain for 3 days.  Burning pain if skin is touched at dorsal wrist, radiating pain up right arm.  Patient is left handed.  Patient denies any injury  Slight swelling in appearance of right wrist, otherwise unremarkable.  First noticed pain when twisting a cap off a water bottle.  Since then pain has escalated  Patient does food preparation.     Past Medical History:  Diagnosis Date  . Bipolar 1 disorder ([place])   . Migraine     There are no active problems to display for this patient.   History reviewed. No pertinent surgical history.  OB History   No obstetric history on file.      Home Medications    Prior to Admission medications   Medication Sig Start Date End Date Taking? Authorizing Provider  acetaminophen (TYLENOL) 325 MG tablet Take 650 mg by mouth every 6 (six) hours as needed.   Yes [provider]  HYDROcodone-acetaminophen (NORCO) 5-325 MG tablet Take 1 tablet by mouth every 6 (six) hours as needed for moderate pain. 06/20/19   Robyn Haber, MD  predniSONE (DELTASONE) 50 MG tablet One daily with food 06/20/19   Robyn Haber, MD  AMITRIPTYLINE HCL PO Take by mouth.  06/20/19  [provider]  traZODone (DESYREL) 50 MG tablet Take 50 mg by mouth at bedtime.  06/20/19  [provider]    Family History Family History  Problem Relation Age of Onset  . Hypertension Mother   . Hypertension Father     Social History Social History   Tobacco Use  . Smoking status: Former Smoker  Substance Use Topics  . Alcohol use: No  . Drug use: Yes    Types: Marijuana     Allergies   Penicillins   Review of Systems Review of Systems  Musculoskeletal:  Positive for joint swelling.  All other systems reviewed and are negative.    Physical Exam Triage Vital Signs ED Triage Vitals [06/20/19 1111]  Enc Vitals Group     BP      Pulse      Resp      Temp      Temp src      SpO2      Weight      Height      Head Circumference      Peak Flow      Pain Score 8     Pain Loc      Pain Edu?      Excl. in [place]?    No data found.  Updated Vital Signs BP (!) 136/94 (BP Location: Left Arm)   Pulse (!) 102   Temp 98.2 F (36.8 C) (Oral)   Resp 18   SpO2 98%    Physical Exam Vitals signs and nursing note reviewed.  Constitutional:      General: She is in acute distress.     Appearance: Normal appearance. She is normal weight. She is diaphoretic. She is not toxic-appearing.  Eyes:     Conjunctiva/sclera: Conjunctivae normal.  Musculoskeletal:        General: Swelling, tenderness and signs of injury present.  Comments: Patient has tenderness over the dorsal wrist and is unwilling to make a fist.  She is able to completely extend her fingers.  There is mild swelling over the mid dorsal wrist  Skin:    General: Skin is warm.  Neurological:     General: No focal deficit present.     Mental Status: She is alert and oriented to person, place, and time.     Motor: No weakness.     Coordination: Coordination normal.  Psychiatric:     Comments: Extremely anxious.  Patient is obviously been crying.      UC Treatments / Results  Labs (all labs ordered are listed, but only abnormal results are displayed) Labs Reviewed - No data to display  EKG   Radiology No results found.  Procedures Procedures (including critical care time)  Medications Ordered in UC Medications - No data to display  Initial Impression / Assessment and Plan / UC Course  I have reviewed the triage vital signs and the nursing notes.  Pertinent labs & imaging results that were available during my care of the patient were reviewed by me and considered  in my medical decision making (see chart for details).    Final Clinical Impressions(s) / UC Diagnoses   Final diagnoses:  Tenosynovitis of right wrist   Discharge Instructions   None    ED Prescriptions    Medication Sig Dispense Auth. Provider   predniSONE (DELTASONE) 50 MG tablet One daily with food 5 tablet Elvina Sidle, MD   HYDROcodone-acetaminophen (NORCO) 5-325 MG tablet Take 1 tablet by mouth every 6 (six) hours as needed for moderate pain. 12 tablet Elvina Sidle, MD     I have reviewed the PDMP during this encounter.   Elvina Sidle, MD 06/20/19 1132

## 2019-06-20 NOTE — ED Triage Notes (Addendum)
Reports right arm pain for 3 days.  Burning pain if skin is touched at wrist, radiating pain up right arm.  Patient is left handed.  Patient denies any injury  Slight swelling in appearance of right wrist, otherwise unremarkable.  First noticed pain when twisting a cap off a water bottle.  Since then pain has escalated

## 2019-08-13 ENCOUNTER — Ambulatory Visit (HOSPITAL_COMMUNITY)
Admission: EM | Admit: 2019-08-13 | Discharge: 2019-08-13 | Disposition: A | Discharge status: Home / Self Care | Payer: Medicare HMO | Attending: Internal Medicine | Admitting: Internal Medicine

## 2019-08-13 ENCOUNTER — Encounter (HOSPITAL_COMMUNITY): Payer: Self-pay | Admitting: Emergency Medicine

## 2019-08-13 DIAGNOSIS — M792 Neuralgia and neuritis, unspecified: Secondary | ICD-10-CM

## 2019-08-13 MED ORDER — PREDNISONE 50 MG PO TABS: ORAL_TABLET | ORAL | 0 refills | Status: AC

## 2019-08-13 MED ORDER — GABAPENTIN 100 MG PO CAPS: 100.0000 mg | ORAL_CAPSULE | Freq: Two times a day (BID) | ORAL | 0 refills | Status: AC

## 2019-08-13 NOTE — ED Triage Notes (Signed)
Pt here for persistent right arm pain from elbow down.... seen here on 06/20/2019 for similar sx  Wanting same cocktail that was given then  Dx w/tendonitis... denies inj/trauma... has a splint to arm w/relief.   A&O x4... no acute distress.

## 2019-08-13 NOTE — ED Provider Notes (Signed)
[place]    CSN: [id number] Arrival date & time: 08/13/19  1013      History   Chief Complaint Chief Complaint  Patient presents with  . Arm Pain    HPI Marilyn Ferguson is a 58 y.o. female history of bipolar disorder, migraine comes to urgent care with complaints of burning pain over the forearm.  Symptoms started couple months ago.  She was seen here at the urgent care and given a short course of steroids with 6. initially pain improved but recurred after a short period of time.  Pain is currently severe.  Localized to the forearm.  Aggravated by movement and touching the skin.  She tried some steroids and oral pain medication from the last time she was seen here with no improvement.  Pain is nonradiating.  No weakness in the upper extremity.  No neck pain.  Patient works as a Geographical information systems officer.  No trauma to the upper extremity.   HPI  Past Medical History:  Diagnosis Date  . Bipolar 1 disorder ([place])   . Migraine     There are no problems to display for this patient.   History reviewed. No pertinent surgical history.  OB History   No obstetric history on file.      Home Medications    Prior to Admission medications   Medication Sig Start Date End Date Taking? Authorizing Provider  acetaminophen (TYLENOL) 325 MG tablet Take 650 mg by mouth every 6 (six) hours as needed.    [provider]  gabapentin (NEURONTIN) 100 MG capsule Take 1 capsule (100 mg total) by mouth 2 (two) times daily for 10 days. 08/13/19 08/23/19  Chase Picket, MD  HYDROcodone-acetaminophen (NORCO) 5-325 MG tablet Take 1 tablet by mouth every 6 (six) hours as needed for moderate pain. 06/20/19   Robyn Haber, MD  predniSONE (DELTASONE) 50 MG tablet One daily with food 08/13/19   Chase Picket, MD  AMITRIPTYLINE HCL PO Take by mouth.  06/20/19  [provider]  traZODone (DESYREL) 50 MG tablet Take 50 mg by mouth at bedtime.  06/20/19  [provider]    Family History Family History  Problem Relation Age of Onset  . Hypertension Mother   . Hypertension Father     Social History Social History   Tobacco Use  . Smoking status: Former Research scientist (life sciences)  . Smokeless tobacco: Never Used  Substance Use Topics  . Alcohol use: No  . Drug use: Yes    Types: Marijuana     Allergies   Penicillins   Review of Systems Review of Systems  Constitutional: Negative for activity change, chills, fatigue and fever.  HENT: Negative.   Respiratory: Negative.   Gastrointestinal: Negative.   Genitourinary: Negative.   Musculoskeletal: Negative for arthralgias, joint swelling and myalgias.  Skin: Negative for color change, pallor, rash and wound.  Neurological: Negative for dizziness, weakness, light-headedness and headaches.  Psychiatric/Behavioral: Negative for confusion and decreased concentration.     Physical Exam Triage Vital Signs ED Triage Vitals  Enc Vitals Group     BP 08/13/19 1046 (!) 143/98     Pulse Rate 08/13/19 1046 92     Resp 08/13/19 1046 20     Temp 08/13/19 1046 98.3 F (36.8 C)     Temp Source 08/13/19 1046 Oral     SpO2 08/13/19 1046 99 %     Weight --      Height --  Head Circumference --      Peak Flow --      Pain Score 08/13/19 1047 7     Pain Loc --      Pain Edu? --      Excl. in [place]? --    No data found.  Updated Vital Signs BP (!) 143/98 (BP Location: Right Arm)   Pulse 92   Temp 98.3 F (36.8 C) (Oral)   Resp 20   SpO2 99%   Visual Acuity Right Eye Distance:   Left Eye Distance:   Bilateral Distance:    Right Eye Near:   Left Eye Near:    Bilateral Near:     Physical Exam Vitals and nursing note reviewed.  Constitutional:      General: She is in acute distress.     Appearance: She is not ill-appearing.  Cardiovascular:     Rate and Rhythm: Normal rate and regular rhythm.     Pulses: Normal pulses.     Heart sounds: Normal heart sounds.  Pulmonary:     Effort: Pulmonary  effort is normal. No respiratory distress.     Breath sounds: Normal breath sounds. No rhonchi or rales.  Abdominal:     General: Bowel sounds are normal.     Palpations: Abdomen is soft.     Tenderness: There is no abdominal tenderness.  Musculoskeletal:        General: No swelling or tenderness. Normal range of motion.     Cervical back: Normal range of motion. No rigidity or tenderness.  Skin:    Capillary Refill: Capillary refill takes less than 2 seconds.  Neurological:     General: No focal deficit present.     Mental Status: She is alert. Mental status is at baseline.     Cranial Nerves: No cranial nerve deficit.     Sensory: No sensory deficit.     Motor: No weakness.     Coordination: Coordination normal.     Gait: Gait normal.      UC Treatments / Results  Labs (all labs ordered are listed, but only abnormal results are displayed) Labs Reviewed - No data to display  EKG   Radiology No results found.  Procedures Procedures (including critical care time)  Medications Ordered in UC Medications - No data to display  Initial Impression / Assessment and Plan / UC Course  I have reviewed the triage vital signs and the nursing notes.  Pertinent labs & imaging results that were available during my care of the patient were reviewed by me and considered in my medical decision making (see chart for details).     1.  Neuropathic pain of the right forearm: Prednisone 50 mg orally daily x5 days Neurontin 100 mg twice daily for 10 days If patient symptoms persist she is advised to return to urgent care.    She may eventually need a neurologist.   Final Clinical Impressions(s) / UC Diagnoses   Final diagnoses:  Neuropathic pain   Discharge Instructions   None    ED Prescriptions    Medication Sig Dispense Auth. Provider   predniSONE (DELTASONE) 50 MG tablet One daily with food 5 tablet , Britta Mccreedy, MD   gabapentin (NEURONTIN) 100 MG capsule Take 1  capsule (100 mg total) by mouth 2 (two) times daily for 10 days. 20 capsule , Britta Mccreedy, MD     PDMP not reviewed this encounter.   Merrilee Jansky, MD 08/13/19 [id number]

## 2021-02-23 ENCOUNTER — Ambulatory Visit: Payer: Medicare Other | Admitting: Orthopedic Surgery

## 2021-04-09 ENCOUNTER — Encounter (HOSPITAL_BASED_OUTPATIENT_CLINIC_OR_DEPARTMENT_OTHER): Payer: Self-pay | Admitting: Emergency Medicine

## 2021-04-09 ENCOUNTER — Other Ambulatory Visit: Payer: Self-pay

## 2021-04-09 ENCOUNTER — Emergency Department (HOSPITAL_BASED_OUTPATIENT_CLINIC_OR_DEPARTMENT_OTHER)
Admission: EM | Admit: 2021-04-09 | Discharge: 2021-04-09 | Disposition: A | Discharge status: Home / Self Care | Payer: Medicare Other | Attending: Emergency Medicine | Admitting: Emergency Medicine

## 2021-04-09 DIAGNOSIS — K219 Gastro-esophageal reflux disease without esophagitis: Secondary | ICD-10-CM | POA: Insufficient documentation

## 2021-04-09 DIAGNOSIS — R142 Eructation: Secondary | ICD-10-CM | POA: Diagnosis not present

## 2021-04-09 DIAGNOSIS — Z87891 Personal history of nicotine dependence: Secondary | ICD-10-CM | POA: Diagnosis not present

## 2021-04-09 DIAGNOSIS — R197 Diarrhea, unspecified: Secondary | ICD-10-CM | POA: Diagnosis not present

## 2021-04-09 LAB — CBC
HCT: 51.1 % — ABNORMAL HIGH (ref 36.0–46.0)
Hemoglobin: 16.7 g/dL — ABNORMAL HIGH (ref 12.0–15.0)
MCH: 28.1 pg (ref 26.0–34.0)
MCHC: 32.7 g/dL (ref 30.0–36.0)
MCV: 85.9 fL (ref 80.0–100.0)
Platelets: 339 10*3/uL (ref 150–400)
RBC: 5.95 MIL/uL — ABNORMAL HIGH (ref 3.87–5.11)
RDW: 13.3 % (ref 11.5–15.5)
WBC: 7 10*3/uL (ref 4.0–10.5)
nRBC: 0 % (ref 0.0–0.2)

## 2021-04-09 LAB — COMPREHENSIVE METABOLIC PANEL
ALT: 14 U/L (ref 0–44)
AST: 13 U/L — ABNORMAL LOW (ref 15–41)
Albumin: 4.4 g/dL (ref 3.5–5.0)
Alkaline Phosphatase: 71 U/L (ref 38–126)
Anion gap: 11 (ref 5–15)
BUN: 20 mg/dL (ref 6–20)
CO2: 25 mmol/L (ref 22–32)
Calcium: 9.6 mg/dL (ref 8.9–10.3)
Chloride: 105 mmol/L (ref 98–111)
Creatinine, Ser: 0.71 mg/dL (ref 0.44–1.00)
GFR, Estimated: >60 mL/min (ref >60)
Glucose, Bld: 91 mg/dL (ref 70–99)
Potassium: 3.7 mmol/L (ref 3.5–5.1)
Sodium: 141 mmol/L (ref 135–145)
Total Bilirubin: 1 mg/dL (ref 0.3–1.2)
Total Protein: 7.3 g/dL (ref 6.5–8.1)

## 2021-04-09 LAB — URINALYSIS, ROUTINE W REFLEX MICROSCOPIC
Bilirubin Urine: NEGATIVE
Glucose, UA: NEGATIVE mg/dL
Ketones, ur: 15 mg/dL — AB
Nitrite: NEGATIVE
Specific Gravity, Urine: 1.026 (ref 1.005–1.030)
pH: 5.5 (ref 5.0–8.0)

## 2021-04-09 LAB — LIPASE, BLOOD: Lipase: 33 U/L (ref 11–51)

## 2021-04-09 NOTE — ED Provider Notes (Signed)
[facility] EMERGENCY DEPARTMENT Provider Note  CSN: [id number] Arrival date & time: 04/09/21 1120    History Chief Complaint  Patient presents with   Diarrhea    Marilyn Ferguson is a 60 y.o. female reports she had watery diarrhea, belching and reflux for the past 4 days, has been taking OTC meds with good improvement and today she feels well. She has not had any diarrhea today. No fever, no blood. She has been able to tolerate PO fluids without difficulty.    Past Medical History:  Diagnosis Date   Bipolar 1 disorder (HCC)    Migraine     History reviewed. No pertinent surgical history.  Family History  Problem Relation Age of Onset   Hypertension Mother    Hypertension Father     Social History   Tobacco Use   Smoking status: Former   Smokeless tobacco: Never  Substance Use Topics   Alcohol use: No   Drug use: Yes    Types: Marijuana     Home Medications Prior to Admission medications   Medication Sig Start Date End Date Taking? Authorizing Provider  acetaminophen (TYLENOL) 325 MG tablet Take 650 mg by mouth every 6 (six) hours as needed.    [provider]  gabapentin (NEURONTIN) 100 MG capsule Take 1 capsule (100 mg total) by mouth 2 (two) times daily for 10 days. 08/13/19 08/23/19  Merrilee Jansky, MD  HYDROcodone-acetaminophen (NORCO) 5-325 MG tablet Take 1 tablet by mouth every 6 (six) hours as needed for moderate pain. 06/20/19   Elvina Sidle, MD  predniSONE (DELTASONE) 50 MG tablet One daily with food 08/13/19   Merrilee Jansky, MD  AMITRIPTYLINE HCL PO Take by mouth.  06/20/19  [provider]  traZODone (DESYREL) 50 MG tablet Take 50 mg by mouth at bedtime.  06/20/19  [provider]     Allergies    Penicillins   Review of Systems   Review of Systems A comprehensive review of systems was completed and negative except as noted in HPI.    Physical Exam BP (!) 157/97 (BP Location: Right Arm)    Pulse 78   Temp 98.1 F (36.7 C)   Resp 16   Ht 5' (1.524 m)   Wt 59 kg   SpO2 99%   BMI 25.39 kg/m   Physical Exam Vitals and nursing note reviewed.  Constitutional:      Appearance: Normal appearance.  HENT:     Head: Normocephalic and atraumatic.     Nose: Nose normal.     Mouth/Throat:     Mouth: Mucous membranes are moist.  Eyes:     Extraocular Movements: Extraocular movements intact.     Conjunctiva/sclera: Conjunctivae normal.  Cardiovascular:     Rate and Rhythm: Normal rate.  Pulmonary:     Effort: Pulmonary effort is normal.     Breath sounds: Normal breath sounds.  Abdominal:     General: Abdomen is flat.     Palpations: Abdomen is soft.     Tenderness: There is no abdominal tenderness.  Musculoskeletal:        General: No swelling. Normal range of motion.     Cervical back: Neck supple.  Skin:    General: Skin is warm and dry.  Neurological:     General: No focal deficit present.     Mental Status: She is alert.  Psychiatric:        Mood and Affect: Mood normal.  ED Results / Procedures / Treatments   Labs (all labs ordered are listed, but only abnormal results are displayed) Labs Reviewed  COMPREHENSIVE METABOLIC PANEL - Abnormal; Notable for the following components:      Result Value   AST 13 (*)    All other components within normal limits  CBC - Abnormal; Notable for the following components:   RBC 5.95 (*)    Hemoglobin 16.7 (*)    HCT 51.1 (*)    All other components within normal limits  URINALYSIS, ROUTINE W REFLEX MICROSCOPIC - Abnormal; Notable for the following components:   Hgb urine dipstick LARGE (*)    Ketones, ur 15 (*)    Protein, ur TRACE (*)    Leukocytes,Ua SMALL (*)    All other components within normal limits  LIPASE, BLOOD    EKG None   Radiology No results found.  Procedures Procedures  Medications Ordered in the ED Medications - No data to display   MDM Rules/Calculators/A&P MDM Patient with  recent GI illness. Symptoms have essentially resolved and she feels well now. Labs are unremarkable. She reports microscopic hematuria is baseline for her. She has a benign exam. Recommend PCP follow up, advance diet as tolerated and RTED for any other concerns.   ED Course  I have reviewed the triage vital signs and the nursing notes.  Pertinent labs & imaging results that were available during my care of the patient were reviewed by me and considered in my medical decision making (see chart for details).     Final Clinical Impression(s) / ED Diagnoses Final diagnoses:  Diarrhea, unspecified type    Rx / DC Orders ED Discharge Orders     None        Pollyann Savoy, MD 04/09/21 1409

## 2021-04-09 NOTE — ED Triage Notes (Signed)
Pt presents to ED Pov. Pt c/o acid reflux, diarrhea, hot/cold sweats, not eating x4d. Pt reports that she has been taking imodium, and Pedialyte at home.

## 2021-04-18 ENCOUNTER — Other Ambulatory Visit: Payer: Self-pay | Admitting: Family Medicine

## 2021-04-18 DIAGNOSIS — Z122 Encounter for screening for malignant neoplasm of respiratory organs: Secondary | ICD-10-CM

## 2021-05-02 ENCOUNTER — Other Ambulatory Visit: Payer: Self-pay | Admitting: Family Medicine

## 2021-05-02 DIAGNOSIS — Z1231 Encounter for screening mammogram for malignant neoplasm of breast: Secondary | ICD-10-CM

## 2021-05-23 ENCOUNTER — Other Ambulatory Visit: Payer: Self-pay

## 2021-05-23 ENCOUNTER — Ambulatory Visit
Admission: RE | Admit: 2021-05-23 | Discharge: 2021-05-23 | Disposition: A | Discharge status: Home / Self Care | Payer: Medicare Other | Source: Ambulatory Visit | Attending: Family Medicine | Admitting: Family Medicine

## 2021-05-23 DIAGNOSIS — Z122 Encounter for screening for malignant neoplasm of respiratory organs: Secondary | ICD-10-CM

## 2021-11-19 ENCOUNTER — Emergency Department (HOSPITAL_BASED_OUTPATIENT_CLINIC_OR_DEPARTMENT_OTHER)
Admission: EM | Admit: 2021-11-19 | Discharge: 2021-11-20 | Disposition: A | Discharge status: Home / Self Care | Payer: Medicare Other | Attending: Emergency Medicine | Admitting: Emergency Medicine

## 2021-11-19 ENCOUNTER — Other Ambulatory Visit: Payer: Self-pay

## 2021-11-19 ENCOUNTER — Encounter (HOSPITAL_BASED_OUTPATIENT_CLINIC_OR_DEPARTMENT_OTHER): Payer: Self-pay

## 2021-11-19 DIAGNOSIS — R519 Headache, unspecified: Secondary | ICD-10-CM | POA: Diagnosis not present

## 2021-11-19 DIAGNOSIS — R202 Paresthesia of skin: Secondary | ICD-10-CM | POA: Insufficient documentation

## 2021-11-19 DIAGNOSIS — M542 Cervicalgia: Secondary | ICD-10-CM | POA: Diagnosis present

## 2021-11-19 MED ORDER — DIAZEPAM 5 MG PO TABS
5.0000 mg | ORAL_TABLET | Freq: Once | ORAL | Status: AC
  Administered 2021-11-20: 5 mg via ORAL
  Filled 2021-11-19: qty 1

## 2021-11-19 MED ORDER — KETOROLAC TROMETHAMINE 30 MG/ML IJ SOLN
30.0000 mg | Freq: Once | INTRAMUSCULAR | Status: AC
  Administered 2021-11-20: 30 mg via INTRAMUSCULAR
  Filled 2021-11-19: qty 1

## 2021-11-19 NOTE — ED Triage Notes (Signed)
Pt presents to the ED with left sided neck pain that "jolts electricity" into her left ear. Pt states that if she lays really still and doesn't eat or drink the "electrical jolts" lessens. Pt reports this starting on Wednesday. Pt reports the pain to be a 10 when she is "shocked". Pt does have a hx of migraines, but reports a consistent HA. EKG obtained at time of triage r/t BP 170/120. ?

## 2021-11-20 MED ORDER — NAPROXEN 500 MG PO TABS: 500.0000 mg | ORAL_TABLET | Freq: Two times a day (BID) | ORAL | 0 refills | Status: DC

## 2021-11-20 MED ORDER — CYCLOBENZAPRINE HCL 10 MG PO TABS: 10.0000 mg | ORAL_TABLET | Freq: Two times a day (BID) | ORAL | 0 refills | Status: AC | PRN

## 2021-11-20 NOTE — Discharge Instructions (Signed)
You were seen today for neck pain.  I suspect you are having some muscle spasm and inflammation that may be affecting nerves and causing inflammation of the nerves.  Take medications as prescribed. Make sure you are doing good range of motion exercises of the neck. ?

## 2021-11-20 NOTE — ED Provider Notes (Signed)
?[facility] EMERGENCY DEPT ?Provider Note ? ? ?CSN: [id number] ?Arrival date & time: 11/19/21  1907 ? ?  ? ?History ? ?Chief Complaint  ?Patient presents with  ? Neck Pain  ? ? ?Marilyn Ferguson is a 61 y.o. female. ? ?HPI ? ?  ? ?This is a 61 year old female who presents with left neck pain.  Patient reports that she woke up several nights ago with a jolt in the left side of her neck.  She states that it originates in her left shoulder area and radiates up into her left jaw and ear.  She describes it as Software engineer"electric jolts."  She states it is worse with any movement.  She has taken ibuprofen with only minimal relief.  She has had some headache.  No fever.  No earache.  No tooth ache but she does states she has a history of poor dentition and has a current bridge.  She states she is very anxious about her symptoms.  No noted trauma history or heavy lifting.  Patient denies weakness or numbness of the upper extremities.  She does describe several weeks ago having some tingling in the left hand. ? ?Home Medications ?Prior to Admission medications   ?Medication Sig Start Date End Date Taking? Authorizing Provider  ?cyclobenzaprine (FLEXERIL) 10 MG tablet Take 1 tablet (10 mg total) by mouth 2 (two) times daily as needed for muscle spasms. 11/20/21  Yes Berniece Abid, Mayer Maskerourtney F, MD  ?naproxen (NAPROSYN) 500 MG tablet Take 1 tablet (500 mg total) by mouth 2 (two) times daily. 11/20/21  Yes Taylan Mayhan, Mayer Maskerourtney F, MD  ?acetaminophen (TYLENOL) 325 MG tablet Take 650 mg by mouth every 6 (six) hours as needed.    [provider]  ?gabapentin (NEURONTIN) 100 MG capsule Take 1 capsule (100 mg total) by mouth 2 (two) times daily for 10 days. 08/13/19 08/23/19  Merrilee JanskyLamptey, Philip O, MD  ?HYDROcodone-acetaminophen (NORCO) 5-325 MG tablet Take 1 tablet by mouth every 6 (six) hours as needed for moderate pain. 06/20/19   Elvina SidleLauenstein, Kurt, MD  ?predniSONE (DELTASONE) 50 MG tablet One daily with food 08/13/19   Merrilee JanskyLamptey, Philip O,  MD  ?AMITRIPTYLINE HCL PO Take by mouth.  06/20/19  [provider]  ?traZODone (DESYREL) 50 MG tablet Take 50 mg by mouth at bedtime.  06/20/19  [provider]  ?   ? ?Allergies    ?Penicillins   ? ?Review of Systems   ?Review of Systems  ?Constitutional:  Negative for fever.  ?Musculoskeletal:  Positive for neck pain.  ?Neurological:  Negative for weakness and numbness.  ?All other systems reviewed and are negative. ? ?Physical Exam ?Updated Vital Signs ?BP 135/88 (BP Location: Right Arm)   Pulse 89   Temp 98.5 ?F (36.9 ?C) (Oral)   Resp 16   Ht 1.524 m (5')   Wt 61.2 kg   SpO2 96%   BMI 26.37 kg/m?  ?Physical Exam ?Vitals and nursing note reviewed.  ?Constitutional:   ?   Appearance: She is well-developed. She is not ill-appearing.  ?HENT:  ?   Head: Normocephalic and atraumatic.  ?   Nose: Nose normal.  ?   Mouth/Throat:  ?   Mouth: Mucous membranes are moist.  ?Eyes:  ?   Pupils: Pupils are equal, round, and reactive to light.  ?Neck:  ?   Comments: Tenderness to palpation left trapezius with trigger point that reproduces pain, no significant spasm noted ?Cardiovascular:  ?   Rate and Rhythm: Normal rate and  regular rhythm.  ?   Heart sounds: Normal heart sounds.  ?Pulmonary:  ?   Effort: Pulmonary effort is normal. No respiratory distress.  ?   Breath sounds: No wheezing.  ?Abdominal:  ?   Palpations: Abdomen is soft.  ?Musculoskeletal:     ?   General: Tenderness present.  ?   Cervical back: Normal range of motion.  ?Skin: ?   General: Skin is warm and dry.  ?Neurological:  ?   Mental Status: She is alert and oriented to person, place, and time.  ?   Comments: 5 out of 5 strength grip biceps, triceps, deltoid bilateral  ?Psychiatric:     ?   Mood and Affect: Mood normal.  ? ? ?ED Results / Procedures / Treatments   ?Labs ?(all labs ordered are listed, but only abnormal results are displayed) ?Labs Reviewed - No data to display ? ?EKG ?EKG Interpretation ? ?Date/Time:  Sunday November 19 2021 19:51:05 EDT ?Ventricular Rate:  117 ?PR Interval:  176 ?QRS Duration: 70 ?QT Interval:  320 ?QTC Calculation: 446 ?R Axis:   52 ?Text Interpretation: Sinus tachycardia Otherwise normal ECG When compared with ECG of 03-Apr-2014 18:15, Criteria for Septal infarct are no longer Present Confirmed by Ross Marcus ([id number]) on 11/19/2021 11:55:42 PM ? ?Radiology ?No results found. ? ?Procedures ?Procedures  ? ? ?Medications Ordered in ED ?Medications  ?diazepam (VALIUM) tablet 5 mg (5 mg Oral Given 11/20/21 0006)  ?ketorolac (TORADOL) 30 MG/ML injection 30 mg (30 mg Intramuscular Given 11/20/21 0007)  ? ? ?ED Course/ Medical Decision Making/ A&P ?  ?                        ?Medical Decision Making ?Risk ?Prescription drug management. ? ? ?This patient presents to the ED for concern of neck pain, this involves an extensive number of treatment options, and is a complaint that carries with it a high risk of complications and morbidity.  The differential diagnosis includes muscle spasm, nerve pain, TMJ, cervical radiculopathy ? ?MDM:   ? ?This is a 61 year old female who presents with shooting pains in the left neck.  She is nontoxic and vital signs are reassuring.  Reports pain originates in the neck and migrates into the left ear.  Sounds very suspicious for nerve pain.  She has no radicular symptoms but did have some tingling in the hand.  She has no weakness.  She has point tenderness and a trigger point over the left neck.  She has normal range of motion but significant pain with range of motion.  Patient was given Valium for muscle relaxation and Toradol.  She had significant relief of her symptoms.  Recommend ongoing naproxen and Flexeril for symptoms.  Recommend ongoing range of motion exercises.  Patient stated understanding ?(Labs, imaging) ? ?Labs: ?I Ordered, and personally interpreted labs.  The pertinent results include: N/A ? ?Imaging Studies ordered: ?I ordered imaging studies including N/A ?I  independently visualized and interpreted imaging. ?I agree with the radiologist interpretation ? ?Additional history obtained from chart review.  External records from outside source obtained and reviewed including prior evaluation ? ?Critical Interventions: ?IM Toradol, Flexeril ? ?Consultations: ?I requested consultation with the NA,  and discussed lab and imaging findings as well as pertinent plan - they recommend: N/A ? ?Cardiac Monitoring: ?The patient was maintained on a cardiac monitor.  I personally viewed and interpreted the cardiac monitored which showed an underlying rhythm of: N/A ? ?  Reevaluation: ?After the interventions noted above, I reevaluated the patient and found that they have :improved ? ? ?Considered admission for:  N/A ? ?Social Determinants of Health: ?Lives independently ? ?Disposition: Discharge ? ?Co morbidities that complicate the patient evaluation ? ?Past Medical History:  ?Diagnosis Date  ? Bipolar 1 disorder (HCC)   ? Migraine   ?  ? ?Medicines ?Meds ordered this encounter  ?Medications  ? diazepam (VALIUM) tablet 5 mg  ? ketorolac (TORADOL) 30 MG/ML injection 30 mg  ? naproxen (NAPROSYN) 500 MG tablet  ?  Sig: Take 1 tablet (500 mg total) by mouth 2 (two) times daily.  ?  Dispense:  30 tablet  ?  Refill:  0  ? cyclobenzaprine (FLEXERIL) 10 MG tablet  ?  Sig: Take 1 tablet (10 mg total) by mouth 2 (two) times daily as needed for muscle spasms.  ?  Dispense:  20 tablet  ?  Refill:  0  ?  ?I have reviewed the patients home medicines and have made adjustments as needed ? ?Problem List / ED Course: ?Problem List Items Addressed This Visit   ?None ?Visit Diagnoses   ? ? Neck pain    -  Primary  ? ?  ?  ? ? ? ? ? ? ? ? ? ? ? ? ?Final Clinical Impression(s) / ED Diagnoses ?Final diagnoses:  ?Neck pain  ? ? ?Rx / DC Orders ?ED Discharge Orders   ? ?      Ordered  ?  naproxen (NAPROSYN) 500 MG tablet  2 times daily       ? 11/20/21 0144  ?  cyclobenzaprine (FLEXERIL) 10 MG tablet  2 times  daily PRN       ? 11/20/21 0144  ? ?  ?  ? ?  ? ? ?  ?Shon Baton, MD ?11/20/21 0148 ? ?

## 2021-11-20 NOTE — ED Notes (Addendum)
Pt describes left sided neck pain with jolts of electricity going up towards left ear, started on WED.  Also reports having a migraine ?Denies any injury ?

## 2021-11-30 ENCOUNTER — Other Ambulatory Visit: Payer: Self-pay | Admitting: Family Medicine

## 2021-11-30 ENCOUNTER — Ambulatory Visit
Admission: RE | Admit: 2021-11-30 | Discharge: 2021-11-30 | Disposition: A | Discharge status: Home / Self Care | Payer: Medicare Other | Source: Ambulatory Visit | Attending: Family Medicine | Admitting: Family Medicine

## 2021-11-30 DIAGNOSIS — M5412 Radiculopathy, cervical region: Secondary | ICD-10-CM

## 2022-04-17 ENCOUNTER — Other Ambulatory Visit: Payer: Self-pay | Admitting: Family Medicine

## 2022-04-17 DIAGNOSIS — R918 Other nonspecific abnormal finding of lung field: Secondary | ICD-10-CM

## 2022-04-17 DIAGNOSIS — Z122 Encounter for screening for malignant neoplasm of respiratory organs: Secondary | ICD-10-CM

## 2022-04-17 DIAGNOSIS — Z87891 Personal history of nicotine dependence: Secondary | ICD-10-CM

## 2022-04-30 IMAGING — CR DG CERVICAL SPINE COMPLETE 4+V
6 series · 6 of 6 positions shown · non-contrast
Comparison: X-ray 04/03/2014.

CLINICAL DATA: Cervical radiculopathy.

EXAM:
CERVICAL SPINE - COMPLETE 4+ VIEW

[w cervical spine lat]
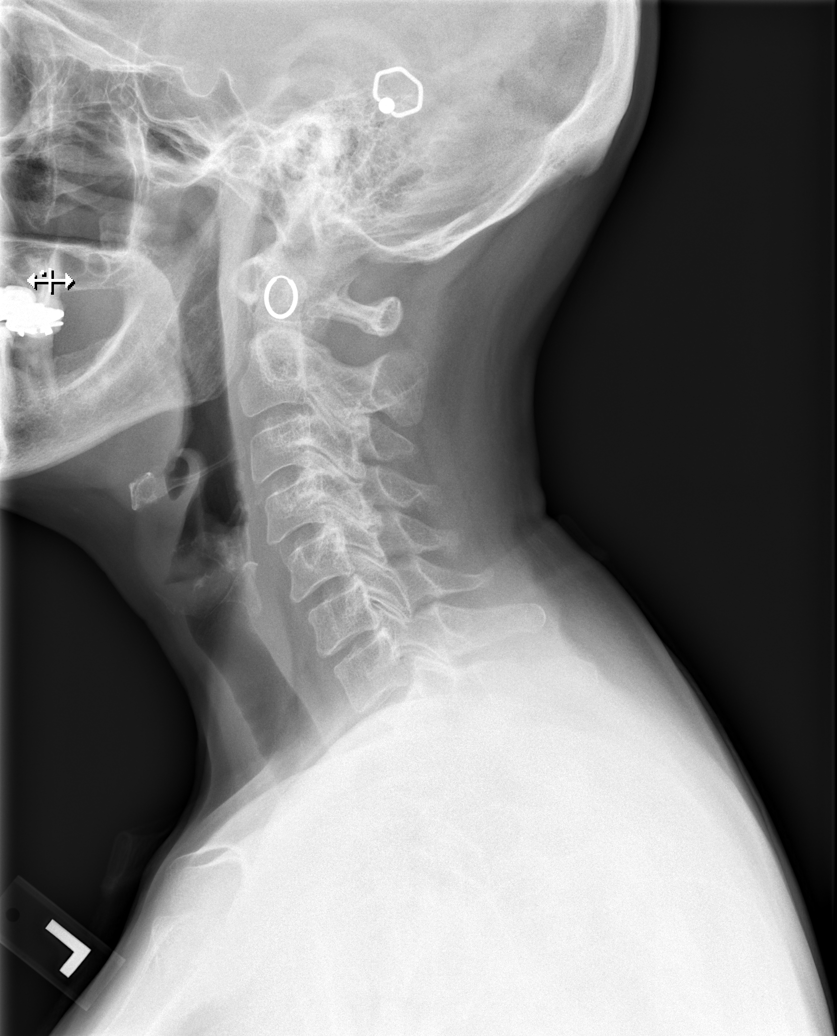

[w cervical spine ap_obl (1 of 2)]
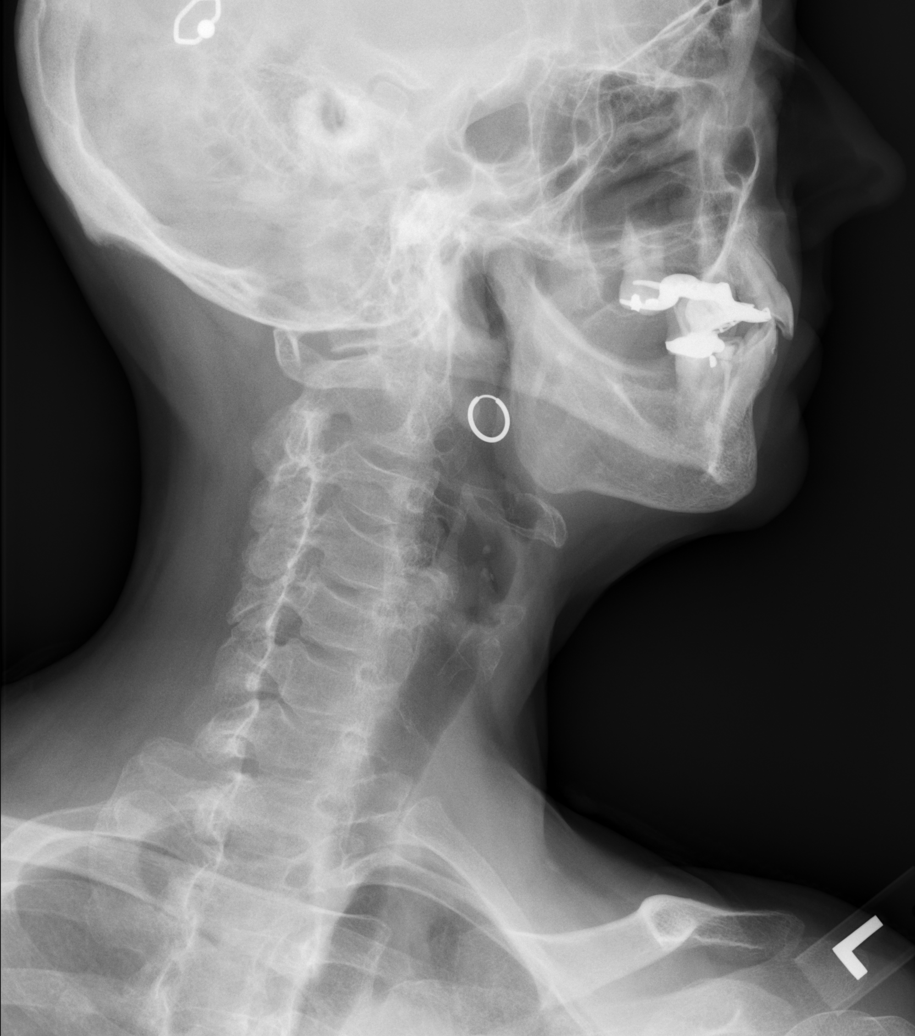

[w cervical spine ap_obl (2 of 2)]
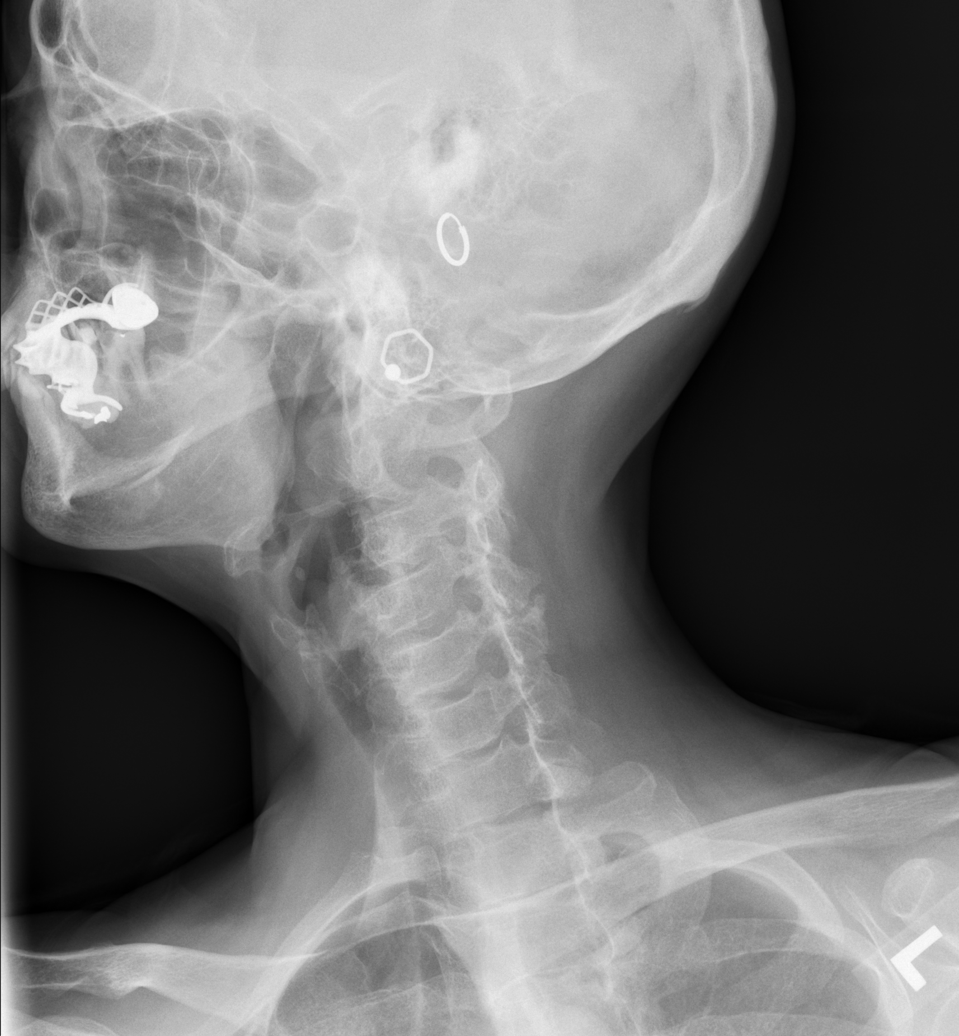

[w cervical spine ap]
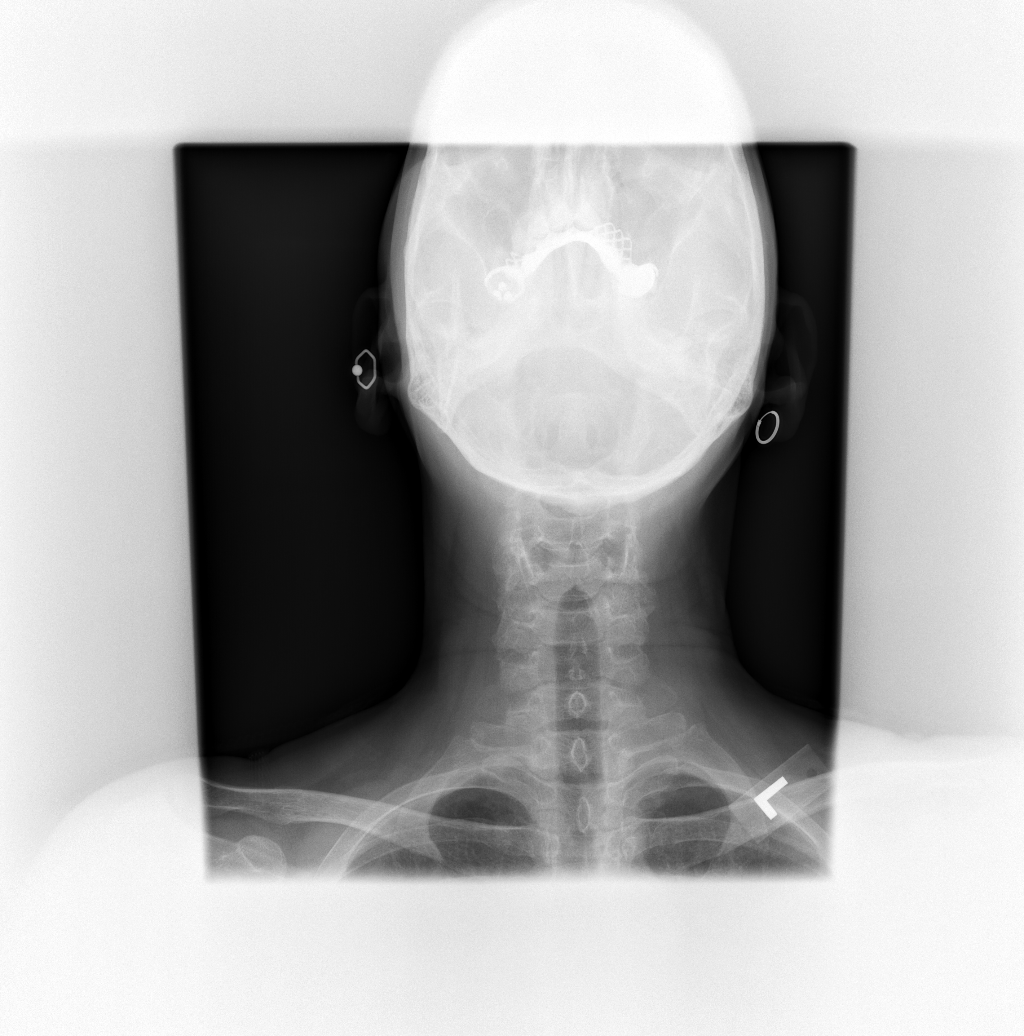

[w cervical spine odontoid (1 of 2)]
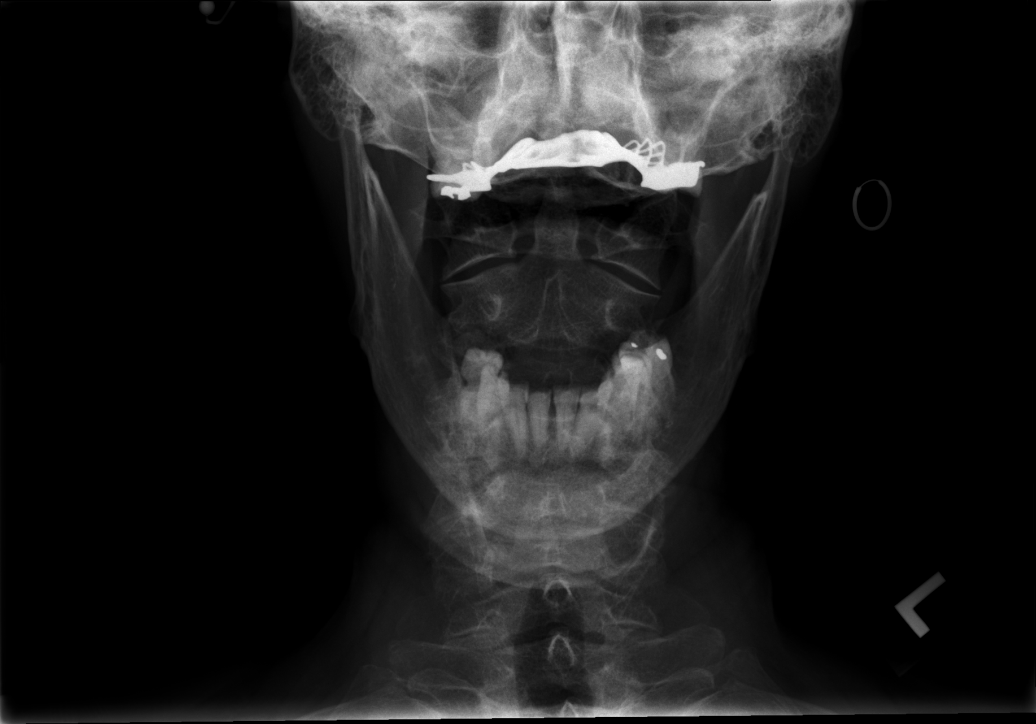

[w cervical spine odontoid (2 of 2)]
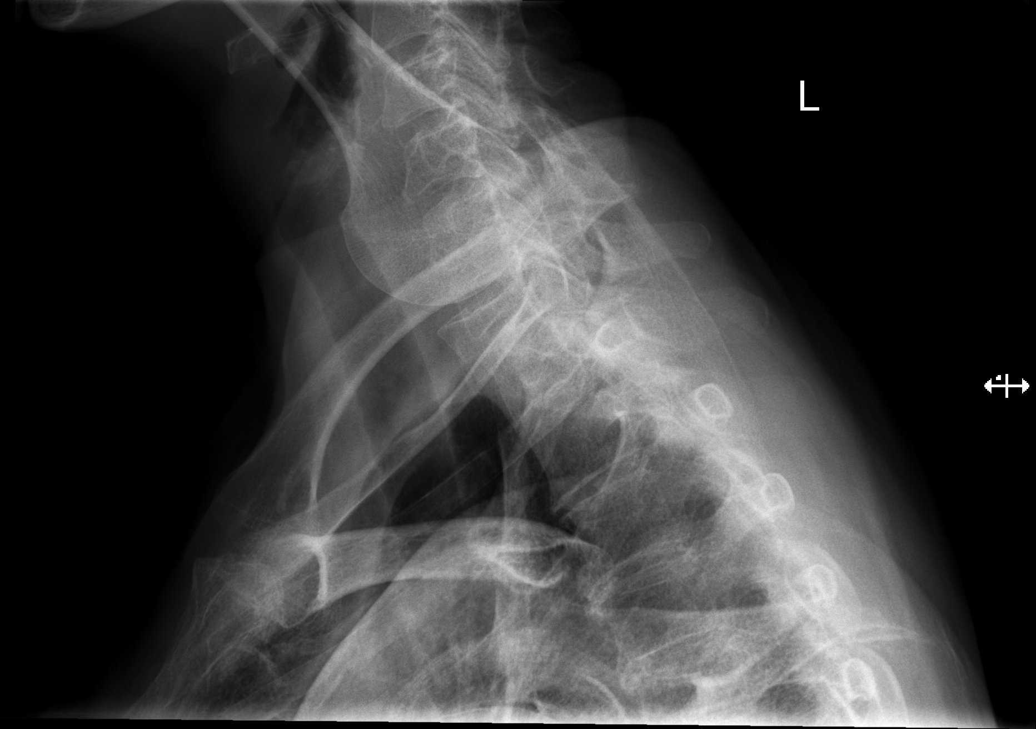

[6 of 6 positions shown; findings below may reference images not displayed]

FINDINGS: There is no evidence of cervical spine fracture or prevertebral soft
tissue swelling. There is stable 2 mm of anterolisthesis at C4-C5.
No other significant bone abnormalities are identified.
IMPRESSION: 1. No evidence for fracture.
2. Stable grade 1 anterolisthesis at C4-C5.

## 2022-05-30 ENCOUNTER — Inpatient Hospital Stay: Admission: RE | Admit: 2022-05-30 | Payer: Medicare Other | Source: Ambulatory Visit

## 2024-07-19 ENCOUNTER — Other Ambulatory Visit: Payer: Self-pay

## 2024-07-19 ENCOUNTER — Encounter (HOSPITAL_BASED_OUTPATIENT_CLINIC_OR_DEPARTMENT_OTHER): Payer: Self-pay

## 2024-07-19 ENCOUNTER — Emergency Department (HOSPITAL_BASED_OUTPATIENT_CLINIC_OR_DEPARTMENT_OTHER)

## 2024-07-19 ENCOUNTER — Emergency Department (HOSPITAL_BASED_OUTPATIENT_CLINIC_OR_DEPARTMENT_OTHER): Admitting: Radiology

## 2024-07-19 ENCOUNTER — Emergency Department (HOSPITAL_BASED_OUTPATIENT_CLINIC_OR_DEPARTMENT_OTHER)
Admission: EM | Admit: 2024-07-19 | Discharge: 2024-07-19 | Disposition: A | Discharge status: Home / Self Care | Attending: Emergency Medicine | Admitting: Emergency Medicine

## 2024-07-19 DIAGNOSIS — M79641 Pain in right hand: Secondary | ICD-10-CM | POA: Diagnosis present

## 2024-07-19 DIAGNOSIS — L0889 Other specified local infections of the skin and subcutaneous tissue: Secondary | ICD-10-CM | POA: Insufficient documentation

## 2024-07-19 DIAGNOSIS — D72829 Elevated white blood cell count, unspecified: Secondary | ICD-10-CM | POA: Diagnosis not present

## 2024-07-19 DIAGNOSIS — L089 Local infection of the skin and subcutaneous tissue, unspecified: Secondary | ICD-10-CM

## 2024-07-19 LAB — CBC WITH DIFFERENTIAL/PLATELET
Abs Immature Granulocytes: 0.03 K/uL (ref 0.00–0.07)
Basophils Absolute: 0 K/uL (ref 0.0–0.1)
Basophils Relative: 0 %
Eosinophils Absolute: 0 K/uL (ref 0.0–0.5)
Eosinophils Relative: 0 %
HCT: 49.4 % — ABNORMAL HIGH (ref 36.0–46.0)
Hemoglobin: 15.9 g/dL — ABNORMAL HIGH (ref 12.0–15.0)
Immature Granulocytes: 0 %
Lymphocytes Relative: 18 %
Lymphs Abs: 1.9 K/uL (ref 0.7–4.0)
MCH: 27.5 pg (ref 26.0–34.0)
MCHC: 32.2 g/dL (ref 30.0–36.0)
MCV: 85.5 fL (ref 80.0–100.0)
Monocytes Absolute: 0.5 K/uL (ref 0.1–1.0)
Monocytes Relative: 5 %
Neutro Abs: 8.4 K/uL — ABNORMAL HIGH (ref 1.7–7.7)
Neutrophils Relative %: 77 %
Platelets: 291 K/uL (ref 150–400)
RBC: 5.78 MIL/uL — ABNORMAL HIGH (ref 3.87–5.11)
RDW: 13.9 % (ref 11.5–15.5)
WBC: 10.8 K/uL — ABNORMAL HIGH (ref 4.0–10.5)
nRBC: 0 % (ref 0.0–0.2)

## 2024-07-19 LAB — BASIC METABOLIC PANEL WITH GFR
Anion gap: 9 (ref 5–15)
BUN: 14 mg/dL (ref 8–23)
CO2: 28 mmol/L (ref 22–32)
Calcium: 9.4 mg/dL (ref 8.9–10.3)
Chloride: 104 mmol/L (ref 98–111)
Creatinine, Ser: 0.83 mg/dL (ref 0.44–1.00)
GFR, Estimated: >60 mL/min (ref >60)
Glucose, Bld: 93 mg/dL (ref 70–99)
Potassium: 3.9 mmol/L (ref 3.5–5.1)
Sodium: 141 mmol/L (ref 135–145)

## 2024-07-19 MED ORDER — IBUPROFEN 800 MG PO TABS: 800.0000 mg | ORAL_TABLET | Freq: Three times a day (TID) | ORAL | 0 refills | Status: AC

## 2024-07-19 MED ORDER — KETOROLAC TROMETHAMINE 30 MG/ML IJ SOLN
30.0000 mg | Freq: Once | INTRAMUSCULAR | Status: AC
  Administered 2024-07-19: 30 mg via INTRAMUSCULAR
  Filled 2024-07-19: qty 1

## 2024-07-19 MED ORDER — CLINDAMYCIN HCL 300 MG PO CAPS: 300.0000 mg | ORAL_CAPSULE | Freq: Four times a day (QID) | ORAL | 0 refills | Status: AC

## 2024-07-19 NOTE — Discharge Instructions (Signed)
 Thank you for visiting the Emergency Department today. It was a pleasure to be part of your healthcare team. You have been prescribed an antibiotic, please finish entire regimen, and you should take your medications as directed.  As we discussed, please watch for any GI side effects from your antibiotic regimen.  If you have any questions about your medicines, please call your pharmacy or healthcare provider. As discussed, it is important to watch for warning signs such as worsening pain, fever, swelling, or redness.  Please follow-up with your primary care within 1 week as we discussed. If any of these happen, return to the Emergency Department or call 911.

## 2024-07-19 NOTE — ED Notes (Signed)
 Pt alert and oriented X 4 at the time of discharge. RR even and unlabored. No acute distress noted. Pt verbalized understanding of discharge instructions as discussed. Pt ambulatory to lobby at time of discharge.

## 2024-07-19 NOTE — ED Triage Notes (Signed)
 Pt reports right hand pain & swelling that started last night. Worsened today. Denies injury. Denies other symptoms

## 2024-07-19 NOTE — ED Provider Notes (Signed)
 [facility] EMERGENCY DEPARTMENT AT [facility] Provider Note   CSN: [id number] Arrival date & time: 07/19/24  1412     Patient presents with: Hand Pain   Marilyn Ferguson is a 63 y.o. female who presents to the ED with right hand pain that began 2 days ago.  Patient states that she had right hand pain yesterday and this morning woke up with redness, swelling, warmth to the touch.  Patient states that she has a history of cellulitis infections.  {Add pertinent medical, surgical, social history, OB history to HPI:32947}  Hand Pain       Prior to Admission medications   Medication Sig Start Date End Date Taking? Authorizing Provider  acetaminophen  (TYLENOL ) 325 MG tablet Take 650 mg by mouth every 6 (six) hours as needed.    [provider]  cyclobenzaprine  (FLEXERIL ) 10 MG tablet Take 1 tablet (10 mg total) by mouth 2 (two) times daily as needed for muscle spasms. 11/20/21   Horton, Charmaine FALCON, MD  gabapentin  (NEURONTIN ) 100 MG capsule Take 1 capsule (100 mg total) by mouth 2 (two) times daily for 10 days. 08/13/19 08/23/19  Blaise Aleene KIDD, MD  HYDROcodone -acetaminophen  (NORCO) 5-325 MG tablet Take 1 tablet by mouth every 6 (six) hours as needed for moderate pain. 06/20/19   Mario Million, MD  naproxen  (NAPROSYN ) 500 MG tablet Take 1 tablet (500 mg total) by mouth 2 (two) times daily. 11/20/21   Bari Charmaine FALCON, MD  predniSONE  (DELTASONE ) 50 MG tablet One daily with food 08/13/19   Blaise Aleene KIDD, MD  AMITRIPTYLINE HCL PO Take by mouth.  06/20/19  [provider]  traZODone (DESYREL) 50 MG tablet Take 50 mg by mouth at bedtime.  06/20/19  [provider]    Allergies: Penicillins    Review of Systems  Updated Vital Signs BP (!) 158/97 (BP Location: Left Arm)   Pulse (!) 103   Temp 98.2 F (36.8 C)   Resp 18   SpO2 97%   Physical Exam  (all labs ordered are listed, but only abnormal results are displayed) Labs Reviewed - No  data to display          EKG: None  Radiology: DG Hand Complete Right Result Date: 07/19/2024 CLINICAL DATA:  Right hand pain and swelling 2 days. EXAM: RIGHT HAND - COMPLETE 3+ VIEW COMPARISON:  None Available. FINDINGS: Minimal degenerate changes over the first carpometacarpal joint. Bone alignment and mineralization is within normal. No acute fracture or dislocation. No significant soft tissue abnormality. IMPRESSION: 1. No acute findings. 2. Minimal degenerative changes over the first carpometacarpal joint. Electronically Signed   By: Toribio Agreste M.D.   On: 07/19/2024 15:15    {Document cardiac monitor, telemetry assessment procedure when appropriate:32947} Procedures   Medications Ordered in the ED - No data to display    {Click here for ABCD2, HEART and other calculators REFRESH Note before signing:1}                              Medical Decision Making Amount and/or Complexity of Data Reviewed Radiology: ordered.   ***  {Document critical care time when appropriate  Document review of labs and clinical decision tools ie [id number], etc  Document your independent review of radiology images and any outside records  Document your discussion with family members, caretakers and with consultants  Document social determinants of health affecting pt's care  Document your decision  making why or why not admission, treatments were needed:32947:::1}   Final diagnoses:  None    ED Discharge Orders     None
